# Patient Record
Sex: Female | Born: 1980 | Race: Black or African American | Hispanic: No | Smoking: Never smoker
Health system: Southern US, Community
[De-identification: ages and names within clinical notes are randomized; demographics above are authoritative.]

## PROBLEM LIST (undated history)

## (undated) DIAGNOSIS — L509 Urticaria, unspecified: Secondary | ICD-10-CM

## (undated) HISTORY — PX: NO PAST SURGERIES: SHX2092

## (undated) HISTORY — DX: Urticaria, unspecified: L50.9

---

## 2001-05-08 ENCOUNTER — Emergency Department (HOSPITAL_COMMUNITY): Admission: EM | Admit: 2001-05-08 | Discharge: 2001-05-08 | Payer: Self-pay | Admitting: *Deleted

## 2003-08-29 ENCOUNTER — Ambulatory Visit: Admission: RE | Admit: 2003-08-29 | Discharge: 2003-08-29 | Payer: Self-pay

## 2013-08-09 ENCOUNTER — Ambulatory Visit (INDEPENDENT_AMBULATORY_CARE_PROVIDER_SITE_OTHER): Payer: 59 | Admitting: Physician Assistant

## 2013-08-09 VITALS — BP 140/80 | HR 75 | Temp 98.5°F | Resp 16 | Ht 65.0 in | Wt 167.0 lb

## 2013-08-09 DIAGNOSIS — J019 Acute sinusitis, unspecified: Secondary | ICD-10-CM

## 2013-08-09 MED ORDER — LEVOFLOXACIN 500 MG PO TABS: 500.0000 mg | ORAL_TABLET | Freq: Every day | ORAL | Status: DC

## 2013-08-09 NOTE — Patient Instructions (Signed)

## 2013-08-09 NOTE — Progress Notes (Signed)
Subjective:    Patient ID: Kelly Carney, female    DOB: [birth year], 33 y.o.   MRN: [id number]  HPI 33 y.o. female presents with 1 month history of on and off nasal congestion, sinus pressure, and fatigue. Patient initially presented to her dentist a little over a month ago with the above and was prescribed a Z pack for sinusitis. After taking this her symptoms did improve for a week or two, however they have returned prompting her to come in for evaluation. Her sinus pressure is located along the right maxillary sinus. Some nasal congestion. No drainage. She feels like all of the congestion is stuck in her sinus. Normal hearing. No otalgia. No cough. No SOB or wheezing. Afebrile. No chills. She does have a sinus headache. She has previously gotten some sinus infections, but none recently.    PMH: No past medical history on file.  Home Meds: Prior to Admission medications   Not on File    Allergies: No Known Allergies  History   Social History  . Marital Status: Single    Spouse Name: N/A    Number of Children: N/A  . Years of Education: N/A   Occupational History  . Not on file.   Social History Main Topics  . Smoking status: Never Smoker   . Smokeless tobacco: Not on file  . Alcohol Use: Not on file  . Drug Use: Not on file  . Sexual Activity: Not on file   Other Topics Concern  . Not on file   Social History Narrative  . No narrative on file      Review of Systems  Constitutional: Positive for fatigue. Negative for fever, chills and appetite change.  HENT: Positive for congestion and sinus pressure. Negative for ear pain, hearing loss, postnasal drip, rhinorrhea, sore throat and tinnitus.   Respiratory: Negative for cough, shortness of breath and wheezing.   Gastrointestinal: Negative for nausea, vomiting and diarrhea.  Musculoskeletal: Negative for myalgias.  Neurological: Positive for headaches.       Objective:   Physical Exam  Physical  Exam: Blood pressure 140/80, pulse 75, temperature 98.5 F (36.9 C), temperature source Oral, resp. rate 16, height 5\' 5"  (1.651 m), weight 167 lb (75.751 kg), last menstrual period 07/19/2013, SpO2 100.00%., Body mass index is 27.79 kg/(m^2). General: Well developed, well nourished, in no acute distress. Head: Normocephalic, atraumatic, eyes without discharge, sclera non-icteric, nares are congested. Bilateral auditory canals clear, TM's are without perforation, pearly grey with reflective cone of light bilaterally. Serous effusion bilaterally behind TM's. Maxillary sinus pressure relieved with palpation. Oral cavity moist, dentition normal. Posterior pharynx with post nasal drip and mild erythema. No peritonsillar abscess or tonsillar exudate. Uvula midline.  Neck: Supple. No thyromegaly. Full ROM. No lymphadenopathy. Lungs: Clear bilaterally to auscultation without wheezes, rales, or rhonchi. Breathing is unlabored.  Heart: RRR with S1 S2. No murmurs, rubs, or gallops appreciated. Msk:  Strength and tone normal for age. Extremities: No clubbing or cyanosis. No edema. Neuro: Alert and oriented X 3. Moves all extremities spontaneously. CNII-XII grossly in tact. Psych:  Responds to questions appropriately with a normal affect.        Assessment & Plan:  33 year old female with sinusitis -Levaquin 500 mg 1 po daily #10 no RF -Mucinex -Avoid running while on Levaquin, discussed Achilles tendon rupture risk  -If symptoms persist while on this antibiotic consider imaging    Christell Faith, MHS, PA-C [facility] [address] [place], [place] [address] [facility] 08/09/2013 7:48 PM

## 2015-11-05 DIAGNOSIS — Z832 Family history of diseases of the blood and blood-forming organs and certain disorders involving the immune mechanism: Secondary | ICD-10-CM | POA: Diagnosis not present

## 2015-11-05 DIAGNOSIS — Z13220 Encounter for screening for lipoid disorders: Secondary | ICD-10-CM | POA: Diagnosis not present

## 2015-11-05 DIAGNOSIS — L509 Urticaria, unspecified: Secondary | ICD-10-CM | POA: Diagnosis not present

## 2015-11-05 DIAGNOSIS — Z0000 Encounter for general adult medical examination without abnormal findings: Secondary | ICD-10-CM | POA: Diagnosis not present

## 2015-11-05 DIAGNOSIS — E559 Vitamin D deficiency, unspecified: Secondary | ICD-10-CM | POA: Diagnosis not present

## 2015-11-09 DIAGNOSIS — Z832 Family history of diseases of the blood and blood-forming organs and certain disorders involving the immune mechanism: Secondary | ICD-10-CM | POA: Diagnosis not present

## 2015-11-09 DIAGNOSIS — Z0000 Encounter for general adult medical examination without abnormal findings: Secondary | ICD-10-CM | POA: Diagnosis not present

## 2015-11-09 DIAGNOSIS — E559 Vitamin D deficiency, unspecified: Secondary | ICD-10-CM | POA: Diagnosis not present

## 2015-11-09 DIAGNOSIS — L509 Urticaria, unspecified: Secondary | ICD-10-CM | POA: Diagnosis not present

## 2015-11-09 DIAGNOSIS — Z13220 Encounter for screening for lipoid disorders: Secondary | ICD-10-CM | POA: Diagnosis not present

## 2015-11-19 ENCOUNTER — Encounter: Payer: Self-pay | Admitting: Pediatrics

## 2015-11-19 ENCOUNTER — Ambulatory Visit: Payer: Self-pay | Admitting: Pediatrics

## 2015-11-19 ENCOUNTER — Ambulatory Visit (INDEPENDENT_AMBULATORY_CARE_PROVIDER_SITE_OTHER): Payer: 59 | Admitting: Pediatrics

## 2015-11-19 VITALS — BP 128/84 | HR 88 | Temp 98.9°F | Resp 18 | Ht 64.37 in | Wt 142.2 lb

## 2015-11-19 DIAGNOSIS — L501 Idiopathic urticaria: Secondary | ICD-10-CM | POA: Diagnosis not present

## 2015-11-19 DIAGNOSIS — L500 Allergic urticaria: Secondary | ICD-10-CM | POA: Diagnosis not present

## 2015-11-19 DIAGNOSIS — J3089 Other allergic rhinitis: Secondary | ICD-10-CM

## 2015-11-19 DIAGNOSIS — R5383 Other fatigue: Secondary | ICD-10-CM | POA: Diagnosis not present

## 2015-11-19 NOTE — Patient Instructions (Addendum)
Zyrtec 10 mg once or twice a day for itching or runny nose or itchy eyes Do foods with salicylates make you itch? Avoid foods with large amounts of tomato Environmental control of dust mite  I will call you with the results of your lab work. If you have not heard from me in one week call me

## 2015-11-19 NOTE — Progress Notes (Signed)
[address] [place] [address] Dept: [phone]  New Patient Note  Patient ID: Kelly Carney, female    DOB: 1981-07-04  Age: 35 y.o. MRN: [id number] Date of Office Visit: 11/19/2015 Referring provider: Harlan Stains, MD [place], [place] [address]    Chief Complaint: Urticaria  HPI Kelly Carney presents for evaluation of an itchy rash for about 2 years. The rash  Lasts  5-10 minutes. It is usually worse at night. It happens every 2-3 weeks. One  episode happened when she was outside and her neighbor was mowing the lawn. There are no clearcut precipitants to this rash. She has a history of eczema when she was 57-38 years of age. Sometimes in the springtime she has some nasal congestion.   Review of Systems  Constitutional: Negative.   HENT:       Mild nasal congestion in the springtime  Eyes: Negative.   Respiratory: Negative.   Cardiovascular: Negative.   Gastrointestinal:       She used to have heartburn. Not a problem at this time  Genitourinary: Negative.   Musculoskeletal: Negative.   Skin:       Hives off and on for 2 years  Neurological: Negative.   Endo/Heme/Allergies: Negative.   Psychiatric/Behavioral: Negative.     Outpatient Encounter Prescriptions as of 11/19/2015  Medication Sig  . MONO-LINYAH 0.25-35 MG-MCG tablet   . Multiple Vitamins-Minerals (HAIR SKIN AND NAILS FORMULA PO) Take 1 capsule by mouth daily.  . Omega-3 Fatty Acids (FISH OIL) 1000 MG CAPS Take 1 capsule by mouth daily.  . [DISCONTINUED] levofloxacin (LEVAQUIN) 500 MG tablet Take 1 tablet (500 mg total) by mouth daily.  . [DISCONTINUED] norethindrone-ethinyl estradiol (MICROGESTIN,JUNEL,LOESTRIN) 1-20 MG-MCG tablet Take 1 tablet by mouth daily.   No facility-administered encounter medications on file as of 11/19/2015.     Drug Allergies:  No Known Allergies  Family History: Lafawn's family history includes Hypertension in her mother;  Sarcoidosis in her father and sister. There is no history of Allergic rhinitis, Angioedema, Asthma, Eczema, Immunodeficiency, or Urticaria.. No family history of Lupus  Social and environmental. She is a Marine scientist in the hospital. There are no pets in the home. She has never smoked cigarettes. She is not exposed to cigarette smoking.  Physical Exam: BP 128/84 mmHg  Pulse 88  Temp(Src) 98.9 F (37.2 C) (Oral)  Resp 18  Ht 5' 4.37" (1.635 m)  Wt 142 lb 3.2 oz (64.5 kg)  BMI 24.13 kg/m2   Physical Exam  Constitutional: She is oriented to person, place, and time. She appears well-developed and well-nourished.  HENT:  Eyes were prominent. No erythema in the eyes. Ears normal. Nose normal. Pharynx normal.  Neck: Neck supple. No thyromegaly present.  Cardiovascular:  S1 and S2 normal no murmurs  Pulmonary/Chest:  Clear to percussion and auscultation  Abdominal: Soft. There is no tenderness (no hepatosplenomegaly).  Lymphadenopathy:    She has no cervical adenopathy.  Neurological: She is alert and oriented to person, place, and time.  Skin:  Clear with a suggestion of dermographia  Psychiatric: She has a normal mood and affect. Her behavior is normal. Judgment and thought content normal.  Vitals reviewed.   Diagnostics:  Allergy skin tests were very positive to dust mite. There was some reactivity to tomato but not extremely large  Assessment Assessment and Plan: 1. Allergic urticaria   2. Other allergic rhinitis   3. Idiopathic urticaria   4. Other fatigue  Patient Instructions  Zyrtec 10 mg once or twice a day for itching or runny nose or itchy eyes Do foods with salicylates make you itch? Avoid foods with large amounts of tomato Environmental control of dust mite  I will call you with the results of your lab work. If you have not heard from me in one week call me     Return in about 3 weeks (around 12/10/2015).   Thank you for the opportunity to care for  this patient.  Please do not hesitate to contact me with questions.  Penne Lash, M.D.  [facility] [address] [place], [place] [address] [phone]

## 2015-11-20 ENCOUNTER — Telehealth: Payer: Self-pay | Admitting: Pediatrics

## 2015-11-20 NOTE — Telephone Encounter (Signed)
Pt called and said that she had some of the labs done at dr white"s office about 2 weeks ago. And wanted to know how to get just the ones that she has not had done. And [place] get the results from the dr white office. [phone]

## 2015-11-20 NOTE — Telephone Encounter (Signed)
Patient advised to call Dr. Orest Dikes office and have them fax [place] the results.

## 2015-11-23 DIAGNOSIS — L501 Idiopathic urticaria: Secondary | ICD-10-CM | POA: Diagnosis not present

## 2015-11-23 DIAGNOSIS — R5383 Other fatigue: Secondary | ICD-10-CM | POA: Diagnosis not present

## 2015-11-28 ENCOUNTER — Telehealth: Payer: Self-pay | Admitting: Allergy

## 2015-11-28 LAB — ANA W/REFLEX IF POSITIVE
Anti Nuclear Antibody(ANA): POSITIVE — AB
Centromere Ab Screen: <0.2 AI (ref 0.0–0.9)
ENA RNP Ab: <0.2 AI (ref 0.0–0.9)
ENA SM Ab Ser-aCnc: <0.2 AI (ref 0.0–0.9)
dsDNA Ab: 12 IU/mL — ABNORMAL HIGH (ref 0–9)

## 2015-11-28 LAB — SEDIMENTATION RATE: SED RATE: 7 mm/h (ref 0–32)

## 2015-11-28 LAB — TSH: TSH: 1.52 u[IU]/mL (ref 0.450–4.500)

## 2015-11-28 LAB — T4, FREE: Free T4: 1.22 ng/dL (ref 0.82–1.77)

## 2015-11-28 LAB — THYROID PEROXIDASE ANTIBODY: THYROID PEROXIDASE ANTIBODY: 7 [IU]/mL (ref 0–34)

## 2015-11-28 LAB — THYROGLOBULIN LEVEL: Thyroglobulin (TG-RIA): 50 ng/mL — ABNORMAL HIGH

## 2015-11-28 NOTE — Telephone Encounter (Signed)
Patient is calling Dr. Shaune Leeks back and would like to know what is a Positive ANA. Also she would like to know why she has to make a 3 month follow up.   Please Advise  Thanks

## 2015-11-28 NOTE — Telephone Encounter (Signed)
Mailed copy of labs to patient and faxed copies  to Dr.Cynthia White.

## 2015-11-30 NOTE — Telephone Encounter (Signed)
Phone number is [phone]

## 2015-11-30 NOTE — Telephone Encounter (Signed)
Please call patient and find out what telephone number where she can be reached

## 2015-12-04 NOTE — Telephone Encounter (Signed)
I left a message with the patient to call me at [phone]. Her lab tests results were sent to Dr. Harlan Stains. The serum ANA was slightly elevated and should be repeated in about 3 months unless she develops other problems.

## 2015-12-10 ENCOUNTER — Ambulatory Visit: Payer: 59 | Admitting: Pediatrics

## 2016-04-29 DIAGNOSIS — L509 Urticaria, unspecified: Secondary | ICD-10-CM | POA: Diagnosis not present

## 2016-04-29 DIAGNOSIS — R768 Other specified abnormal immunological findings in serum: Secondary | ICD-10-CM | POA: Diagnosis not present

## 2016-04-29 DIAGNOSIS — M25562 Pain in left knee: Secondary | ICD-10-CM | POA: Diagnosis not present

## 2016-05-05 ENCOUNTER — Ambulatory Visit
Admission: RE | Admit: 2016-05-05 | Discharge: 2016-05-05 | Disposition: A | Discharge status: Home / Self Care | Payer: Self-pay | Source: Ambulatory Visit | Attending: Family Medicine | Admitting: Family Medicine

## 2016-05-05 ENCOUNTER — Other Ambulatory Visit: Payer: Self-pay | Admitting: Family Medicine

## 2016-05-05 DIAGNOSIS — M25562 Pain in left knee: Secondary | ICD-10-CM

## 2016-06-02 DIAGNOSIS — Z01419 Encounter for gynecological examination (general) (routine) without abnormal findings: Secondary | ICD-10-CM | POA: Diagnosis not present

## 2016-06-02 DIAGNOSIS — Z124 Encounter for screening for malignant neoplasm of cervix: Secondary | ICD-10-CM | POA: Diagnosis not present

## 2016-06-02 DIAGNOSIS — Z6824 Body mass index (BMI) 24.0-24.9, adult: Secondary | ICD-10-CM | POA: Diagnosis not present

## 2016-08-22 DIAGNOSIS — H35411 Lattice degeneration of retina, right eye: Secondary | ICD-10-CM | POA: Diagnosis not present

## 2016-09-21 DIAGNOSIS — S43401A Unspecified sprain of right shoulder joint, initial encounter: Secondary | ICD-10-CM | POA: Diagnosis not present

## 2016-10-03 ENCOUNTER — Encounter: Payer: Self-pay | Admitting: Allergy

## 2016-10-03 ENCOUNTER — Ambulatory Visit (INDEPENDENT_AMBULATORY_CARE_PROVIDER_SITE_OTHER): Payer: 59 | Admitting: Allergy

## 2016-10-03 VITALS — BP 114/64 | HR 74 | Temp 97.8°F | Wt 151.8 lb

## 2016-10-03 DIAGNOSIS — J3089 Other allergic rhinitis: Secondary | ICD-10-CM | POA: Diagnosis not present

## 2016-10-03 DIAGNOSIS — L500 Allergic urticaria: Secondary | ICD-10-CM | POA: Diagnosis not present

## 2016-10-03 NOTE — Progress Notes (Signed)
Follow-up Note  RE: Kelly Carney MRN: [id number] DOB: [birth year] Date of Office Visit: 10/03/2016   History of present illness: Kelly Carney is a 36 y.o. female presenting today for follow-up of allergic urticaria. She was seen by Dr. Mohammed Kindle list as a new patient in May 2017. She had some testing done at that time showing positive sensitivity to dust mite and mild reactivity to tomato. She also has a elevated thyroglobulin antibody and also had positive ANA and double-stranded DNA antibody. She has since seen her primary care provider who repeated her ANA and reports that it was negative.  Since her last visit she reports that she takes Zyrtec daily if not every other day. She notes that she will fill itchy on the days that she does not take her Zyrtec. She does not have frank hives with the itching however but she has had flares of her hives on several occasions since her last visit. The last flareup was about 3-4 months ago when she reports she was exposed to the cold temperatures which she has noticed seems to flare her symptoms. She also notes that with grass exposure she will develop hives.  She has been avoiding tomato based products especially in large quantities  She does not completely avoid tomato.  Prior to the testing she denies having any issues surrounding tomato ingestion.   Review of systems: Review of Systems  Constitutional: Negative for chills, fever and malaise/fatigue.  HENT: Negative for congestion, ear discharge, ear pain, nosebleeds, sinus pain, sore throat and tinnitus.   Eyes: Negative for discharge and redness.  Respiratory: Negative for cough, shortness of breath and wheezing.   Cardiovascular: Negative for chest pain.  Gastrointestinal: Negative for abdominal pain, heartburn, nausea and vomiting.  Musculoskeletal: Negative for joint pain and myalgias.  Skin: Positive for itching. Negative for rash.  Neurological: Negative for headaches.    All other  systems negative unless noted above in HPI  Past medical/social/surgical/family history have been reviewed and are unchanged unless specifically indicated below.  No changes  Medication List: Allergies as of 10/03/2016   No Known Allergies     Medication List       Accurate as of 10/03/16 11:47 AM. Always use your most recent med list.          Fish Oil 1000 MG Caps Take 1 capsule by mouth daily.   HAIR SKIN AND NAILS FORMULA PO Take 1 capsule by mouth daily.   MONO-LINYAH 0.25-35 MG-MCG tablet Generic drug:  norgestimate-ethinyl estradiol       Known medication allergies: No Known Allergies   Physical examination: Blood pressure 114/64, pulse 74, temperature 97.8 F (36.6 C), temperature source Oral, weight 151 lb 12.8 oz (68.9 kg), SpO2 97 %.  General: Alert, interactive, in no acute distress. HEENT: PERRLA, TMs pearly gray, turbinates minimally edematous without discharge, post-pharynx non erythematous. Neck: Supple without lymphadenopathy. Lungs: Clear to auscultation without wheezing, rhonchi or rales. {no increased work of breathing. CV: Normal S1, S2 without murmurs. Abdomen: Nondistended, nontender. Skin: Warm and dry, without lesions or rashes. Extremities:  No clubbing, cyanosis or edema. Neuro:   Grossly intact.  Diagnositics/Labs: Labs:  Component     Latest Ref Rng & Units 11/23/2015  Anit Nuclear Antibody(ANA)     Negative Positive (A)  dsDNA Ab     0 - 9 IU/mL 12 (H)  ENA RNP Ab     0.0 - 0.9 AI <0.2  ENA SM Ab Ser-aCnc  0.0 - 0.9 AI <0.2  Scleroderma SCL-70     0.0 - 0.9 AI <0.2  ENA SSA (RO) Ab     0.0 - 0.9 AI <0.2  ENA SSB (LA) Ab     0.0 - 0.9 AI <0.2  Chromatin Ab SerPl-aCnc     0.0 - 0.9 AI <0.2  Anti JO-1     0.0 - 0.9 AI <0.2  CENTROMERE AB SCREEN     0.0 - 0.9 AI <0.2  SEE BELOW      Comment  Sed Rate     0 - 32 mm/hr 7  THYROGLOBULIN (TG-RIA)     ng/mL 50 (H)  Thyroperoxidase Ab SerPl-aCnc     0 - 34 IU/mL 7    TSH     0.450 - 4.500 uIU/mL 1.520  T4,Free(Direct)     0.82 - 1.77 ng/dL 1.22   Assessment and plan: Urticaria with angioedema     - she has an allergic trigger with grass exposure and an autoimmune signal with elevated thyroglobulin antibody that can predispose/manifest as urticaria with angioedema.   - based on her food allergy testing I do not feel she has a tomato allergy however some acidic foods and fruits as well as NSAIDs are known mast cell activator and histamine releasers and can worsen/flare hives and swelling     - If you are well controlled on Zyrtec daily recommend continuing this.   If you are not controlled on daily Zyrtec you may increase to twice a day dosing.   Let us know if you are not controlled with daily zyrtec.      - let us know if hives leave any marks or bruising, you have fevers with hives or joint aches or pain.     Follow-up 1 year or as needed   I appreciate the opportunity to take part in Jalana's care. Please do not hesitate to contact me with questions.  Sincerely,   Prudy Feeler, MD Allergy/Immunology [facility]

## 2016-10-03 NOTE — Patient Instructions (Signed)
Hives and swelling      - you have allergic environmental trigger with grass exposure and an autoimmune signal with elevated thyroglobulin antibody that can predispose you to having hives and swelling.      - If you are well controlled on Zyrtec daily recommend continuing this.   If you are not controlled on daily Zyrtec you may increase to twice a day dosing.   Let us know if you are not controlled with daily zyrtec.      - let us know if hives leave any marks or bruising, you have fevers with hives or joint aches or pain.      - based on your food allergy testing I do not feel you have a tomato allergy however some acidic foods and fruits as well as NSAID medications (like ibuprofen) can worsen/flare hives and swelling  Follow-up 1 year or as needed

## 2016-12-10 DIAGNOSIS — Z136 Encounter for screening for cardiovascular disorders: Secondary | ICD-10-CM | POA: Diagnosis not present

## 2016-12-10 DIAGNOSIS — Z0000 Encounter for general adult medical examination without abnormal findings: Secondary | ICD-10-CM | POA: Diagnosis not present

## 2016-12-10 DIAGNOSIS — E559 Vitamin D deficiency, unspecified: Secondary | ICD-10-CM | POA: Diagnosis not present

## 2016-12-11 DIAGNOSIS — E559 Vitamin D deficiency, unspecified: Secondary | ICD-10-CM | POA: Diagnosis not present

## 2016-12-11 DIAGNOSIS — Z0000 Encounter for general adult medical examination without abnormal findings: Secondary | ICD-10-CM | POA: Diagnosis not present

## 2016-12-23 DIAGNOSIS — M25562 Pain in left knee: Secondary | ICD-10-CM | POA: Diagnosis not present

## 2020-08-27 ENCOUNTER — Other Ambulatory Visit: Payer: Self-pay | Admitting: Obstetrics and Gynecology

## 2020-08-27 DIAGNOSIS — N915 Oligomenorrhea, unspecified: Secondary | ICD-10-CM

## 2020-10-05 ENCOUNTER — Ambulatory Visit
Admission: RE | Admit: 2020-10-05 | Discharge: 2020-10-05 | Disposition: A | Discharge status: Home / Self Care | Payer: 59 | Source: Ambulatory Visit | Attending: Obstetrics and Gynecology | Admitting: Obstetrics and Gynecology

## 2020-10-05 DIAGNOSIS — N915 Oligomenorrhea, unspecified: Secondary | ICD-10-CM

## 2020-10-12 ENCOUNTER — Other Ambulatory Visit: Payer: 59

## 2020-10-22 ENCOUNTER — Other Ambulatory Visit: Payer: 59

## 2021-02-08 ENCOUNTER — Other Ambulatory Visit: Payer: Self-pay | Admitting: Obstetrics and Gynecology

## 2021-02-08 DIAGNOSIS — D352 Benign neoplasm of pituitary gland: Secondary | ICD-10-CM

## 2021-02-10 ENCOUNTER — Ambulatory Visit
Admission: RE | Admit: 2021-02-10 | Discharge: 2021-02-10 | Disposition: A | Discharge status: Home / Self Care | Payer: BLUE CROSS/BLUE SHIELD | Source: Ambulatory Visit | Attending: Obstetrics and Gynecology | Admitting: Obstetrics and Gynecology

## 2021-02-10 ENCOUNTER — Other Ambulatory Visit: Payer: Self-pay

## 2021-02-10 DIAGNOSIS — D352 Benign neoplasm of pituitary gland: Secondary | ICD-10-CM

## 2021-02-10 MED ORDER — GADOBENATE DIMEGLUMINE 529 MG/ML IV SOLN
9.0000 mL | Freq: Once | INTRAVENOUS | Status: AC | PRN
  Administered 2021-02-10: 9 mL via INTRAVENOUS

## 2021-04-17 ENCOUNTER — Ambulatory Visit (HOSPITAL_BASED_OUTPATIENT_CLINIC_OR_DEPARTMENT_OTHER): Payer: BLUE CROSS/BLUE SHIELD | Admitting: Cardiovascular Disease

## 2021-06-04 ENCOUNTER — Ambulatory Visit (HOSPITAL_BASED_OUTPATIENT_CLINIC_OR_DEPARTMENT_OTHER): Payer: BLUE CROSS/BLUE SHIELD | Admitting: Cardiovascular Disease

## 2021-07-02 ENCOUNTER — Encounter (HOSPITAL_BASED_OUTPATIENT_CLINIC_OR_DEPARTMENT_OTHER): Payer: Self-pay | Admitting: Cardiovascular Disease

## 2021-07-02 ENCOUNTER — Ambulatory Visit (HOSPITAL_BASED_OUTPATIENT_CLINIC_OR_DEPARTMENT_OTHER): Payer: BLUE CROSS/BLUE SHIELD | Admitting: Cardiovascular Disease

## 2021-07-02 ENCOUNTER — Other Ambulatory Visit: Payer: Self-pay

## 2021-07-02 ENCOUNTER — Encounter (HOSPITAL_BASED_OUTPATIENT_CLINIC_OR_DEPARTMENT_OTHER): Payer: Self-pay

## 2021-07-02 DIAGNOSIS — I10 Essential (primary) hypertension: Secondary | ICD-10-CM

## 2021-07-02 DIAGNOSIS — Z006 Encounter for examination for normal comparison and control in clinical research program: Secondary | ICD-10-CM

## 2021-07-02 HISTORY — DX: Essential (primary) hypertension: I10

## 2021-07-02 MED ORDER — AMLODIPINE BESYLATE 10 MG PO TABS: 10.0000 mg | ORAL_TABLET | Freq: Every day | ORAL | 3 refills | Status: DC

## 2021-07-02 NOTE — Assessment & Plan Note (Signed)
Blood pressure is elevated both initially and on repeat.  At first there was asymmetry between her arms but not on repeat.  We will increase her amlodipine to 10 mg.  We discussed the fact that her elevated blood pressures are likely due to having a more sedentary lifestyle and gaining weight.  She is going to work on increasing her exercise and wants to enroll in the PREP program through the [facility].  She will also continue to limit her sodium intake.  She is interested in enrolling in our remote patient monitoring program and consents to monitoring through the [employer] remote patient monitoring system.  She will track her blood pressures twice daily and bring to follow-up.  She was given an advance hypertension clinic education book.  Given her age we will check renal artery Dopplers to ensure that there is no renal artery stenosis.

## 2021-07-02 NOTE — Patient Instructions (Signed)
Medication Instructions:  INCREASE AMLODIPINE TO 10 MG DAILY    Labwork: NONE   Testing/Procedures: Your physician has requested that you have a renal artery duplex. During this test, an ultrasound is used to evaluate blood flow to the kidneys. Allow one hour for this exam. Do not eat after midnight the day before and avoid carbonated beverages. Take your medications as you usually do.   Follow-Up: 09/04/2021 10:30 AM WITH PHARM D AT [place] OFFICE  11/06/2021 10:00 AM WITH DR Arona AT [place] will receive a phone call from the PREP exercise and nutrition program to schedule an initial assessment.  Referrals:  WILL HAVE AMY OUR CARE GUIDE REACH OUT TO YOU  FOR STRESS MANAGEMENT   Special Instructions:   MONITOR YOUR BLOOD PRESSURE TWICE A DAY WITH MACHINE GIVEN, MAKE SURE YOU ARE IN APP WHEN CHECKING   DASH Eating Plan DASH stands for "Dietary Approaches to Stop Hypertension." The DASH eating plan is a healthy eating plan that has been shown to reduce high blood pressure (hypertension). It may also reduce your risk for type 2 diabetes, heart disease, and stroke. The DASH eating plan may also help with weight loss. What are tips for following this plan?  General guidelines Avoid eating more than 2,300 mg (milligrams) of salt (sodium) a day. If you have hypertension, you may need to reduce your sodium intake to 1,500 mg a day. Limit alcohol intake to no more than 1 drink a day for nonpregnant women and 2 drinks a day for men. One drink equals 12 oz of beer, 5 oz of wine, or 1 oz of hard liquor. Work with your health care provider to maintain a healthy body weight or to lose weight. Ask what an ideal weight is for you. Get at least 30 minutes of exercise that causes your heart to beat faster (aerobic exercise) most days of the week. Activities may include walking, swimming, or biking. Work with your health care provider or diet and nutrition specialist (dietitian)  to adjust your eating plan to your individual calorie needs. Reading food labels  Check food labels for the amount of sodium per serving. Choose foods with less than 5 percent of the Daily Value of sodium. Generally, foods with less than 300 mg of sodium per serving fit into this eating plan. To find whole grains, look for the word "whole" as the first word in the ingredient list. Shopping Buy products labeled as "low-sodium" or "no salt added." Buy fresh foods. Avoid canned foods and premade or frozen meals. Cooking Avoid adding salt when cooking. Use salt-free seasonings or herbs instead of table salt or sea salt. Check with your health care provider or pharmacist before using salt substitutes. Do not fry foods. Cook foods using healthy methods such as baking, boiling, grilling, and broiling instead. Cook with heart-healthy oils, such as olive, canola, soybean, or sunflower oil. Meal planning Eat a balanced diet that includes: 5 or more servings of fruits and vegetables each day. At each meal, try to fill half of your plate with fruits and vegetables. Up to 6-8 servings of whole grains each day. Less than 6 oz of lean meat, poultry, or fish each day. A 3-oz serving of meat is about the same size as a deck of cards. One egg equals 1 oz. 2 servings of low-fat dairy each day. A serving of nuts, seeds, or beans 5 times each week. Heart-healthy fats. Healthy fats called Omega-3 fatty acids are found  in foods such as flaxseeds and coldwater fish, like sardines, salmon, and mackerel. Limit how much you eat of the following: Canned or prepackaged foods. Food that is high in trans fat, such as fried foods. Food that is high in saturated fat, such as fatty meat. Sweets, desserts, sugary drinks, and other foods with added sugar. Full-fat dairy products. Do not salt foods before eating. Try to eat at least 2 vegetarian meals each week. Eat more home-cooked food and less restaurant, buffet, and fast  food. When eating at a restaurant, ask that your food be prepared with less salt or no salt, if possible. What foods are recommended? The items listed may not be a complete list. Talk with your dietitian about what dietary choices are best for you. Grains Whole-grain or whole-wheat bread. Whole-grain or whole-wheat pasta. Brown rice. [place]row. Bulgur. Whole-grain and low-sodium cereals. Pita bread. Low-fat, low-sodium crackers. Whole-wheat flour tortillas. Vegetables Fresh or frozen vegetables (raw, steamed, roasted, or grilled). Low-sodium or reduced-sodium tomato and vegetable juice. Low-sodium or reduced-sodium tomato sauce and tomato paste. Low-sodium or reduced-sodium canned vegetables. Fruits All fresh, dried, or frozen fruit. Canned fruit in natural juice (without added sugar). Meat and other protein foods Skinless chicken or [place]. Ground chicken or [place]. Pork with fat trimmed off. Fish and seafood. Egg whites. Dried beans, peas, or lentils. Unsalted nuts, nut butters, and seeds. Unsalted canned beans. Lean cuts of beef with fat trimmed off. Low-sodium, lean deli meat. Dairy Low-fat (1%) or fat-free (skim) milk. Fat-free, low-fat, or reduced-fat cheeses. Nonfat, low-sodium ricotta or cottage cheese. Low-fat or nonfat yogurt. Low-fat, low-sodium cheese. Fats and oils Soft margarine without trans fats. Vegetable oil. Low-fat, reduced-fat, or light mayonnaise and salad dressings (reduced-sodium). Canola, safflower, olive, soybean, and sunflower oils. Avocado. Seasoning and other foods Herbs. Spices. Seasoning mixes without salt. Unsalted popcorn and pretzels. Fat-free sweets. What foods are not recommended? The items listed may not be a complete list. Talk with your dietitian about what dietary choices are best for you. Grains Baked goods made with fat, such as croissants, muffins, or some breads. Dry pasta or rice meal packs. Vegetables Creamed or fried vegetables. Vegetables  in a cheese sauce. Regular canned vegetables (not low-sodium or reduced-sodium). Regular canned tomato sauce and paste (not low-sodium or reduced-sodium). Regular tomato and vegetable juice (not low-sodium or reduced-sodium). Angie Fava. Olives. Fruits Canned fruit in a light or heavy syrup. Fried fruit. Fruit in cream or butter sauce. Meat and other protein foods Fatty cuts of meat. Ribs. Fried meat. Berniece Salines. Sausage. Bologna and other processed lunch meats. Salami. Fatback. Hotdogs. Bratwurst. Salted nuts and seeds. Canned beans with added salt. Canned or smoked fish. Whole eggs or egg yolks. Chicken or [place] with skin. Dairy Whole or 2% milk, cream, and half-and-half. Whole or full-fat cream cheese. Whole-fat or sweetened yogurt. Full-fat cheese. Nondairy creamers. Whipped toppings. Processed cheese and cheese spreads. Fats and oils Butter. Stick margarine. Lard. Shortening. Ghee. Bacon fat. Tropical oils, such as coconut, palm kernel, or palm oil. Seasoning and other foods Salted popcorn and pretzels. Onion salt, garlic salt, seasoned salt, table salt, and sea salt. Worcestershire sauce. Tartar sauce. Barbecue sauce. Teriyaki sauce. Soy sauce, including reduced-sodium. Steak sauce. Canned and packaged gravies. Fish sauce. Oyster sauce. Cocktail sauce. Horseradish that you find on the shelf. Ketchup. Mustard. Meat flavorings and tenderizers. Bouillon cubes. Hot sauce and Tabasco sauce. Premade or packaged marinades. Premade or packaged taco seasonings. Relishes. Regular salad dressings. Where to find more information: National Heart,  Lung, and Blood Institute: https://wilson-eaton.com/ American Heart Association: www.heart.org Summary The DASH eating plan is a healthy eating plan that has been shown to reduce high blood pressure (hypertension). It may also reduce your risk for type 2 diabetes, heart disease, and stroke. With the DASH eating plan, you should limit salt (sodium) intake to 2,300 mg a day. If you  have hypertension, you may need to reduce your sodium intake to 1,500 mg a day. When on the DASH eating plan, aim to eat more fresh fruits and vegetables, whole grains, lean proteins, low-fat dairy, and heart-healthy fats. Work with your health care provider or diet and nutrition specialist (dietitian) to adjust your eating plan to your individual calorie needs. This information is not intended to replace advice given to you by your health care provider. Make sure you discuss any questions you have with your health care provider. Document Released: 06/26/2011 Document Revised: 06/19/2017 Document Reviewed: 06/30/2016 Elsevier Patient Education  2020 [employer].

## 2021-07-02 NOTE — Research (Signed)
Subject Name: Kelly Carney met inclusion and exclusion criteria for the Virtual Care and Social Determinant Interventions for the management of hypertension trial.  The informed consent form, study requirements and expectations were reviewed with the subject by Dr. Oval Linsey and myself. The subject was given the opportunity to read the consent and ask questions. The subject verbalized understanding of the trial requirements.  All questions were addressed prior to the signing of the consent form. The subject agreed to participate in the trial and signed the informed consent. The informed consent was obtained prior to performance of any protocol-specific procedures for the subject.  A copy of the signed informed consent was given to the subject and a copy was placed in the subject's medical record.  [place] was randomized to Group 2.

## 2021-07-02 NOTE — Progress Notes (Signed)
Advanced Hypertension Clinic Initial Assessment:    Date:  07/02/2021   ID:  Kelly Carney, DOB 19-Oct-1980, MRN [id number]  PCP:  Harlan Stains, MD  Cardiologist:  None  Nephrologist:  Referring MD: Servando Salina, MD   CC: Hypertension  History of Present Illness:    Kelly Carney is a 40 y.o. female with a hx of hypertension and a prolactinoma here to establish care in the Advanced Hypertension Clinic.  She saw Dr. Garwin Brothers on 01/2021 and her blood pressure was 139/90 on amlodipine.  There was concerned about her early onset of hypertension and was she was referred for secondary work-up.  Her blood pressure became elevated in 2020 around the time of the pandemic.  She noted that at this time she switched to a less active job.  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  Lately she has been working at [employer] job.  She does not get much exercise outside of work.  She does try to cook at home and eats a healthy diet.  She tries to limit her sodium and carbohydrate intake.  She has gained some weight since making this change.  At home her blood pressures been in the 140s over 90s.  She notes that she has been under a lot of stress.  She does have her sister to use as a sounding board.  She has not had any lower extremity edema, orthopnea, or PND.  She denies chest pain or pressure.  She was recently diagnosed with a prolactinoma.  This occurred in the setting of not having her menstrual cycle for several months.  She was started on a medication that she stopped a few weeks ago because she started feeling lightheaded on it.  She notices that there is no change in her blood pressure with or without treatment of her prolactinoma.   Previous antihypertensives: Amlodipine   Past Medical History:  Diagnosis Date   Essential hypertension 07/02/2021   Urticaria     Past Surgical History:  Procedure Laterality Date   NO PAST SURGERIES      Current  Medications: Current Meds  Medication Sig   amLODipine (NORVASC) 5 MG tablet Take 5 mg by mouth daily.   Loratadine 10 MG CAPS Take 1 tablet by mouth daily.   Multiple Vitamins-Minerals (HAIR SKIN AND NAILS FORMULA PO) Take 1 capsule by mouth daily.   Multiple Vitamins-Minerals (VITAMIN D3 COMPLETE) TABS Take 5,000 Units by mouth daily.   sertraline (ZOLOFT) 100 MG tablet Take 150 mg by mouth daily.     Allergies:   Patient has no known allergies.   Social History   Socioeconomic History   Marital status: Single    Spouse name: Not on file   Number of children: Not on file   Years of education: Not on file   Highest education level: Not on file  Occupational History   Not on file  Tobacco Use   Smoking status: Never   Smokeless tobacco: Never  Substance and Sexual Activity   Alcohol use: Yes   Drug use: No   Sexual activity: Yes    Birth control/protection: Pill  Other Topics Concern   Not on file  Social History Narrative   Not on file   Social Determinants of Health   Financial Resource Strain: Low Risk    Difficulty of Paying Living Expenses: Not hard at all  Food Insecurity: No Food Insecurity   Worried About Running Out of  Food in the Last Year: Never true   [place] in the Last Year: Never true  Transportation Needs: No Transportation Needs   Lack of Transportation (Medical): No   Lack of Transportation (Non-Medical): No  Physical Activity: Inactive   Days of Exercise per Week: 0 days   Minutes of Exercise per Session: 0 min  Stress: Not on file  Social Connections: Not on file     Family History: The patient's family history includes Hypertension in her maternal grandmother and mother; Kidney disease in her maternal grandmother; Other in her father and sister; Sarcoidosis in her father and sister. There is no history of Allergic rhinitis, Angioedema, Asthma, Eczema, Immunodeficiency, or Urticaria.  ROS:   Please see the history of present illness.     All other systems reviewed and are negative.  EKGs/Labs/Other Studies Reviewed:    EKG:  EKG is ordered today.  The ekg ordered today demonstrates sinus rhythm.  Rate 70 bpm.  Recent Labs: No results found for requested labs within last 8760 hours.   Recent Lipid Panel No results found for: CHOL, TRIG, HDL, CHOLHDL, VLDL, LDLCALC, LDLDIRECT  Physical Exam:   VS:  BP (!) 136/94 (BP Location: Left Arm, Patient Position: Sitting, Cuff Size: Normal)   Pulse 70   Ht 5\' 4"  (1.626 m)   Wt 170 lb (77.1 kg)   BMI 29.18 kg/m  , BMI Body mass index is 29.18 kg/m. GENERAL:  Well appearing HEENT: Pupils equal round and reactive, fundi not visualized, oral mucosa unremarkable NECK:  No jugular venous distention, waveform within normal limits, carotid upstroke brisk and symmetric, no bruits, no thyromegaly LUNGS:  Clear to auscultation bilaterally HEART:  RRR.  PMI not displaced or sustained,S1 and S2 within normal limits, no S3, no S4, no clicks, no rubs, no murmurs ABD:  Flat, positive bowel sounds normal in frequency in pitch, no bruits, no rebound, no guarding, no midline pulsatile mass, no hepatomegaly, no splenomegaly EXT:  2 plus pulses throughout, no edema, no cyanosis no clubbing SKIN:  No rashes no nodules NEURO:  Cranial nerves II through XII grossly intact, motor grossly intact throughout PSYCH:  Cognitively intact, oriented to person place and time   ASSESSMENT/PLAN:    Essential hypertension Blood pressure is elevated both initially and on repeat.  At first there was asymmetry between her arms but not on repeat.  We will increase her amlodipine to 10 mg.  We discussed the fact that her elevated blood pressures are likely due to having a more sedentary lifestyle and gaining weight.  She is going to work on increasing her exercise and wants to enroll in the PREP program through the [facility].  She will also continue to limit her sodium intake.  She is interested in enrolling in our  remote patient monitoring program and consents to monitoring through the vivify remote patient monitoring system.  She will track her blood pressures twice daily and bring to follow-up.  She was given an advance hypertension clinic education book.  Given her age we will check renal artery Dopplers to ensure that there is no renal artery stenosis.   Screening for Secondary Hypertension:  Causes 07/02/2021  Drugs/Herbals Screened     - Comments limits salt and carbs.  1 coffee daily.  no significant supplemets  Renovascular HTN Screened     - Comments Check renal artery Dopplers  Sleep Apnea Screened     - Comments snores.  No apnea.  Not always rested.  Feels fatigued during the day  Thyroid Disease Screened  Hyperaldosteronism N/A  Pheochromocytoma N/A  Cushing's Syndrome N/A  Hyperparathyroidism Screened     - Comments calcium wnl  Coarctation of the Aorta Screened     - Comments BP intially asymetric but not on repeat.  Pulses are symmetric  Compliance Screened    Relevant Labs/Studies:    Thyroid  Latest Ref Rng & Units 11/23/2015  TSH 0.450 - 4.500 uIU/mL 1.520             Renovascular  07/02/2021  Renal Artery [place] Completed Yes       Disposition:    FU with MD/PharmD in 2 month    Medication Adjustments/Labs and Tests Ordered: Current medicines are reviewed at length with the patient today.  Concerns regarding medicines are outlined above.  Orders Placed This Encounter  Procedures   Amb Referral To Provider Referral Exercise Program (P.R.E.P)   EKG 12-Lead   VAS US RENAL ARTERY DUPLEX    No orders of the defined types were placed in this encounter.    Signed, Skeet Latch, MD  07/02/2021 10:11 AM    [place]

## 2021-07-03 ENCOUNTER — Telehealth: Payer: Self-pay

## 2021-07-03 DIAGNOSIS — Z0000 Encounter for general adult medical examination without abnormal findings: Secondary | ICD-10-CM

## 2021-07-03 NOTE — Telephone Encounter (Signed)
Called the patient to discuss stress management per referral from Dr. Oval Linsey and for a Viv[employer] 24-hour follow-up call. Patient did not answer. Left a message for patient to return call. Will call the patient next week if they have not returned called by Friday, 12/16.    Brahim Dolman Truman Hayward, [facility] Guide, Health Coach [address] [place] [address] Telephone: [phone] Email: [email or web address]

## 2021-07-04 ENCOUNTER — Telehealth: Payer: Self-pay

## 2021-07-04 NOTE — Telephone Encounter (Signed)
Call to pt reference PREP referral  Explained program to patient Is interested but her work schedule (works between [place] and [place]) makes it difficult and would like to do program when she would be in town Will keep patient on list and would prefer [place] location-will recall her in future

## 2021-07-08 ENCOUNTER — Telehealth: Payer: Self-pay

## 2021-07-08 DIAGNOSIS — Z0000 Encounter for general adult medical examination without abnormal findings: Secondary | ICD-10-CM

## 2021-07-08 NOTE — Telephone Encounter (Signed)
Called patient to complete Vivify follow up call and to inquire about health coaching regarding increasing physical activity per survey responses. Patient stated that she is okay with the prompt times and have no issues using the app. Patient expressed that she finds the health tips beneficial but need to work on motivation to increase physical activity. Patient also travels often and want to figure out how to incorporate physical activity regularly.  Patient has been scheduled for her initial health coaching session on 12/23 at 11:30am. Patient wants to focus on managing her bp and increasing physical activity. Patient will be called at that time.   Carrye Goller Truman Hayward, [facility] Guide, Health Coach [address], [address] [place] [address] Telephone: [phone] Email: [email or web address]

## 2021-07-12 ENCOUNTER — Telehealth: Payer: Self-pay

## 2021-07-12 ENCOUNTER — Ambulatory Visit: Payer: BLUE CROSS/BLUE SHIELD

## 2021-07-12 DIAGNOSIS — Z0000 Encounter for general adult medical examination without abnormal findings: Secondary | ICD-10-CM

## 2021-07-12 NOTE — Telephone Encounter (Signed)
Called patient as scheduled for a health coaching session. Patient inquired if she can call back around 1:30-2:00pm. Will wait for patient's callback. If patient does not return call, will reach out to patient on 12/27.   Necia Kamm Truman Hayward, [facility] Guide, Health Coach [address] [place] [address] Telephone: [phone] Email: [email or web address]

## 2021-07-17 ENCOUNTER — Telehealth: Payer: Self-pay

## 2021-07-17 DIAGNOSIS — Z0000 Encounter for general adult medical examination without abnormal findings: Secondary | ICD-10-CM

## 2021-07-17 NOTE — Telephone Encounter (Signed)
Called patient to reschedule health coaching session. Patient did not answer. Left message for patient to return call.    Jeriah Corkum, [facility] Guide, Health Coach [address] [place] [address] Telephone: [phone] Email: [email or web address]  

## 2021-07-18 ENCOUNTER — Telehealth: Payer: Self-pay

## 2021-07-18 DIAGNOSIS — Z0000 Encounter for general adult medical examination without abnormal findings: Secondary | ICD-10-CM

## 2021-07-18 NOTE — Telephone Encounter (Signed)
Called patient to reschedule health coaching session. Patient asked to be scheduled tomorrow between 11-11:30am. Patient has been scheduled at 11:00am on 12/30. Patient will be called at this time.   Apryle Stowell Truman Hayward, [facility] Guide, Health Coach [address] [place] [address] Telephone: [phone] Email: [email or web address]

## 2021-07-19 ENCOUNTER — Telehealth: Payer: Self-pay

## 2021-07-19 ENCOUNTER — Ambulatory Visit (INDEPENDENT_AMBULATORY_CARE_PROVIDER_SITE_OTHER): Payer: BLUE CROSS/BLUE SHIELD

## 2021-07-19 ENCOUNTER — Other Ambulatory Visit: Payer: Self-pay

## 2021-07-19 DIAGNOSIS — Z0000 Encounter for general adult medical examination without abnormal findings: Secondary | ICD-10-CM

## 2021-07-19 NOTE — Progress Notes (Signed)
Appointment Outcome: Completed, Session #: Initial health coaching session Start time: 11:14am   End time: 12:20pm   Total Mins: 66 minutes  AGREEMENTS SECTION     Overall Goal(s): Stress management Increase physical activity                                          Agreement/Action Steps: Exercise at least 3 times week (walk 1-mile in community) Write in journal during wind down time Set an alarm for 8:30pm as a reminder to write Establish and enforce healthy boundaries Conduct self-check-ins Implement positive self-talk Utilize scriptures as Academic librarian support system  Progress Notes:  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 Patient stated that she talked with Dr. Oval Linsey about various activities that she can engage in to increase physical activity. Patient reported that she has been walking one mile in her community approximately 3 times per week.   Patient mentioned that she must work on her motivation so that she can be consistent. Patient stated that she has discussed with her husband regarding getting a gym membership and believes that will be helpful in having support to keep her motivated to engage in physical activity.   Patient discussed the day that her blood pressure was elevated. Patient stated that she was stressed that day. However, patient mentioned that she tried exercising and eating well before increasing her blood pressure medications to manage stress and to lower her readings. Patient's blood pressure has returned to normal since implementing these steps.   Patient expressed that some of her stressors are maintaining healthy relationships and being able to invest time in caring for herself. Patient shared how she has been processing and revisioning relationships with others. Patient mentioned that this has been stressful for her because she finds herself over analyzing her thoughts and  thinking about why things are a certain way or how to problem solve.   Patient stated that she is focused on having peace within and with others. Patient reported that she spends times thinking about her relationships and how they are affecting her and how she will handle various situations that are occurring. Patient mentioned that she has found herself crying when processing her thoughts and emotions regarding these relationships.   Patient mentioned that she wants to refocus her energy on what's important because the process can be emotionally and mentally draining. Patient mentioned that a way she copes with her stressors is by incorporating her faith/spiritual practices. Patient uses an app for [email or web address] to find scriptures that relates to various situations she deals with.   Patient explained that she invests a lot of time and energy into helping others but do not does not implement this for herself. Patient would like to work on improving her self-care.     Coaching Outcomes: Patient will be emailed a copy of the health coaching Welcome Letter and Code of Ethics. Patient will be emailed information on writing in a journal, establishing and maintaining healthy boundaries, conducting self-check-ins, and implementing positive self-talk by incorporating scriptures as affirmations. Patient will continue to exercise at least three times a week (walk one mile in community).  Patient will also be emailed information on other stress management techniques such as deep breathing and mindfulness. Patient will utilize individuals that she has healthy relationships with in a support system.  Patient is interested in writing her prayers in addition to writing in her journal about her stressors. Patient will set an alarm for 8:30pm to remind herself to write in her journal. Patient will continue to use the app for [email or web address] to find scriptures that relate to friendships/relationships that can be used as affirmations  and guidance.

## 2021-07-19 NOTE — Telephone Encounter (Signed)
Called patient for scheduled health coaching session. Patient did not answer. Left a message for patient to return call to hold session or to reschedule.   Evelyn Aguinaldo Truman Hayward, [facility] Guide, Health Coach [address], [address] [place] [address] Telephone: [phone] Email: [email or web address]

## 2021-07-24 ENCOUNTER — Other Ambulatory Visit: Payer: Self-pay

## 2021-07-24 ENCOUNTER — Ambulatory Visit (INDEPENDENT_AMBULATORY_CARE_PROVIDER_SITE_OTHER): Payer: 59

## 2021-07-24 DIAGNOSIS — I10 Essential (primary) hypertension: Secondary | ICD-10-CM

## 2021-07-29 ENCOUNTER — Telehealth: Payer: Self-pay

## 2021-07-29 NOTE — Telephone Encounter (Signed)
Called to discuss PREP schedule at [facility], left voicemail

## 2021-07-29 NOTE — Telephone Encounter (Signed)
Returned my call, discussed PREP classes starting in Jan/Feb; she is traveling for work and very busy, so cannot commit right now, she will call me if her schedule changes and is able to attend in Feb, I will contact her in March when next classes start for March/April.

## 2021-08-01 DIAGNOSIS — I10 Essential (primary) hypertension: Secondary | ICD-10-CM | POA: Diagnosis not present

## 2021-08-02 ENCOUNTER — Telehealth: Payer: Self-pay

## 2021-08-02 ENCOUNTER — Ambulatory Visit: Payer: BLUE CROSS/BLUE SHIELD

## 2021-08-02 DIAGNOSIS — Z0000 Encounter for general adult medical examination without abnormal findings: Secondary | ICD-10-CM

## 2021-08-02 NOTE — Telephone Encounter (Signed)
Called patient for scheduled health coaching session. Patient was unable to participate in the session at this time and inquired if she would be able to call back after she tie up some loose ends with work. Patient will call back after 2:00pm today. If patient has not returned call at this time will call patient back by 4:00pm today.   Dawnita Molner Truman Hayward, [facility] Guide, Health Coach [address], [address] [place] [address] Telephone: [phone] Email: [email or web address]

## 2021-08-22 ENCOUNTER — Telehealth: Payer: Self-pay | Admitting: Pharmacist

## 2021-08-22 ENCOUNTER — Telehealth: Payer: Self-pay

## 2021-08-22 DIAGNOSIS — Z0000 Encounter for general adult medical examination without abnormal findings: Secondary | ICD-10-CM

## 2021-08-22 NOTE — Telephone Encounter (Signed)
Called patient because she indicated in [place] response that she did not have enough medication to last through the weekend. Patient stated that she answered the question that way because she need to pick up her medications. Patient did not express any financial barriers to picking up her medications.   Patient was asked if she would like to resume health coaching. Patient is interested in health coaching and has been scheduled for her next session on 2/3 at 4:30pm. Patient will be called at this time.   Patient needed to reschedule her pharmacy appointment. Spoke with Gerald Stabs in pharmacy and transferred patient.   Deunte Bledsoe Truman Hayward, [facility] Guide, Health Coach [address] [place] [address] Telephone: [phone] Email: [email or web address]

## 2021-08-22 NOTE — Telephone Encounter (Signed)
Called to assist pt w/r/s but they wanted a sooner date and we do not have anything available any time soon until march so they decided to hold off and keep current appt

## 2021-08-22 NOTE — Telephone Encounter (Signed)
Patient requests call to reschedule upcoming appointment

## 2021-08-23 ENCOUNTER — Other Ambulatory Visit: Payer: Self-pay

## 2021-08-23 ENCOUNTER — Ambulatory Visit (INDEPENDENT_AMBULATORY_CARE_PROVIDER_SITE_OTHER): Payer: BLUE CROSS/BLUE SHIELD

## 2021-08-23 DIAGNOSIS — Z0000 Encounter for general adult medical examination without abnormal findings: Secondary | ICD-10-CM

## 2021-08-23 NOTE — Progress Notes (Signed)
Appointment Outcome: Completed, Session #: 1 Start time: 4:34pm   End time: 5:06pm   Total Mins: 32 minutes  AGREEMENTS SECTION   Overall Goal(s): Stress management Increase physical activity                                           Agreement/Action Steps: Exercise at least 3 times week (walk 1-mile in community) Write in journal during wind down time Set an alarm for 8:30pm as a reminder to write Establish and enforce healthy boundaries Conduct self-check-ins Implement positive self-talk Utilize scriptures as Academic librarian support system  Progress Notes:  Patient rated her stress level a 4 out of 10. Patient stated that at this time, she is dealing with everyday responsibilities. Patient stated that she has been reading more to help manage her stress by using scriptures for positive self-talk/affirmations. Patient stated that her support group has been supportive. Patient and friends send scriptures to encourage each other through stressful times. Patient stated that she recognized that it's hard for her to know what her limits are. Patient stated that she had the tendency to push self beyond her limits.  Patient has established and is enforcing healthy boundaries. Patient stated this step has been going well. Patient stated that she has been adjusting to dealing with people in various relationships in a different way. Patient stated that she has been praying for health with enforcing boundaries. Patient stated that she has been practicing not saying yes, being okay with the boundaries, and is constantly working to maintain them.  Patient stated that writing in a journal and shared that it has been a helpful step. Patient stated that she runs to her journal when she is frustrated. Patient stated that she wants to be more consistent with writing on the days when she is not stressed. Patient mentioned that writing is a nice outlet for her. Patient stated that she read what she wrote  and reflect to help her reorganize her thoughts and process her emotions.   Patient expressed that she has had a difficult time walking outdoors consistently 3 times per week. Patient shared that her allergies kept her from walking as much as she would have liked and had to take allergy medications to help. Patient is trying to figure out plans for a gym membership. Patient is trying to find a reasonably priced membership. Patient stated that she has also been traveling between [place] for work, which eliminates time from her schedule to incorporate exercise. Patient mentioned that she has been looking on YouTube for videos and have found some by Feliberto Harts that she can follow along with. Patient stated that her schedule is more flexible on MWF.   Indicators of Success and Accountability:  Patient has been using scriptures for positive self-talk/affirmations. Patient has been utilizing her support system as well.  Readiness: Patient is in the action phase of stress management and increasing physical activity. Strengths and Supports: Patient is being supported by like-minded friends. Patient is focused on implementing self-care practices. Challenges and Barriers: Patient does not foresee any challenges to implementing her action steps over the next two weeks.   Coaching Outcomes: Patient stated that when she is stressed, she will continue to put herself in a different head space. Patient mentioned that she believes that it's okay to feel and she recognizes that she is not alone.  Patient will create  a schedule to add a time frame for exercising on MWF.   Patient will utilize workout videos from YouTube to follow along during her workouts.   Patient will start writing in her journal daily. Patient will write what she is grateful for or accomplished on days that she doesn't find herself stressed to help with the consistency of writing daily.   Attempted: Fulfilled - Patient has established and  is enforcing healthy boundaries. Patient is conducting self-check-ins and implementing positive self-talk/affirmation using scriptures. Patient is utilizing her support system. Partial - Patient has been walking with her husband but not consistently for 3 days per week.

## 2021-08-28 ENCOUNTER — Ambulatory Visit (HOSPITAL_BASED_OUTPATIENT_CLINIC_OR_DEPARTMENT_OTHER): Payer: BLUE CROSS/BLUE SHIELD | Admitting: Cardiovascular Disease

## 2021-08-31 DIAGNOSIS — I10 Essential (primary) hypertension: Secondary | ICD-10-CM | POA: Diagnosis not present

## 2021-09-04 ENCOUNTER — Ambulatory Visit: Payer: BLUE CROSS/BLUE SHIELD

## 2021-09-06 ENCOUNTER — Other Ambulatory Visit: Payer: Self-pay

## 2021-09-06 ENCOUNTER — Ambulatory Visit (INDEPENDENT_AMBULATORY_CARE_PROVIDER_SITE_OTHER): Payer: BLUE CROSS/BLUE SHIELD

## 2021-09-06 DIAGNOSIS — Z0000 Encounter for general adult medical examination without abnormal findings: Secondary | ICD-10-CM

## 2021-09-06 NOTE — Progress Notes (Signed)
Appointment Outcome: Completed, Session #: 2 Start time: 4:31pm   End time: 5:04pm   Total Mins: 33 minutes  AGREEMENTS SECTION  Overall Goal(s): Stress management Increase physical activity                                           Agreement/Action Steps: Exercise at least 3 times week (walk 1-mile in community) Write in journal during wind down time Set an alarm for 8:30pm as a reminder to write Establish and enforce healthy boundaries Conduct self-check-ins Implement positive self-talk Utilize scriptures as Academic librarian support system  Progress Notes:  Patient stated that she has been able to exercise three times a week at [employer] since she has a membership there. Patient stated that she has worked out on Tuesdays, twice on Wednesdays, and Fridays. Patient is working on a schedule that she can work out with her husband. Patient mentioned that she doesn't like going to the gym late in the day and that it helps a lot to go in the morning. Patient reported that exercising during this time of day helps her to feel better, she doesn't feel as fatigued during the day, and it sets the tone for her mentally/emotionally.   Patient stated that she only wrote in her journal once in the past two weeks. Patient shared that she has focused more on establishing a workout routine since the last session. Patient mentioned that she has also been trying to go to bed earlier so that she can get to the gym by 6:00am, which interferes with the time she winds down for the evening. Patient is implementing getting more sleep so she can maintain her work out schedule. Patient feels that writing in a journal is helpful because she is constantly reflecting mentally and getting things down on paper may help to free up mental space.  Patient shared that when dealing with others, she has had to conduct self-check-ins and implement positive self-talk to manage stress. Patient stated that enforcing her  boundaries has been the most helpful in managing stress. Patient has done a lot of mental reflecting on how these boundaries are affecting her and determining how she can be okay with maintaining these boundaries. Patient expressed that maintaining boundaries is a work in progress. Patient finds with each step of enforcing and maintaining boundaries has its own set of challenges. Patient is recognizing that her boundaries are not the same boundaries that others hold.   Patient's perspective is changing in relation to healthy boundaries to where she is motivating herself, setting boundaries for herself, and putting the same energy into herself as she would otherwise do for others. Patient expressed that she has been standing up for herself more. Patient also shared that using the scriptures as affirmations is a reminder that she is not alone. Patient has utilized her support system in the past two weeks by engaging in conversations with her husband and mother.   Indicators of Success and Accountability:  Patient has been able to enforce and maintain healthy boundaries as her indicator of success and accountability.  Readiness: Patient is in the action phase of stress management and increasing physical activity.   Strengths and Supports: Patient is being supported by her husband and family. Patient strength is her faith to be able to implement these changes.  Challenges and Barriers: Patient does not foresee any challenges/barriers to implementing  her action steps over the next two weeks.   Coaching Outcomes: Patient is interested in writing in her journal at least twice a week. Patient will write about her stressors and research a scripture that pertains to her situation that she finds encouraging to aid in dealing with various concerns.   Patient will implement the following action steps outlined below over the next two weeks.    Agreement/Action Steps: Exercise at least 3 times week (Tuesday,  Wednesday, and Friday) Write in journal during wind down time Set an alarm for 8:30pm as a reminder to write Establish and enforce healthy boundaries Conduct self-check-ins Implement positive self-talk Utilize scriptures as Academic librarian support system   Attempted: Fulfilled - Patient completed the weekly agreement in full and was able to meet the challenge.

## 2021-09-20 ENCOUNTER — Ambulatory Visit: Payer: 59

## 2021-09-20 ENCOUNTER — Telehealth: Payer: Self-pay

## 2021-09-20 DIAGNOSIS — Z0000 Encounter for general adult medical examination without abnormal findings: Secondary | ICD-10-CM

## 2021-09-20 NOTE — Telephone Encounter (Signed)
Called patient to hold health coaching session as scheduled. Patient did not answer. Left a message for the patient to return call to hold session or to reschedule. ? ? ?Kelly Carney, [name] ?[facility] ?Care Guide, Health Coach ?[address] ?[place] [address] ?Telephone: [phone] ?Email: [email or web address] ? ?

## 2021-09-30 DIAGNOSIS — I10 Essential (primary) hypertension: Secondary | ICD-10-CM | POA: Diagnosis not present

## 2021-10-02 ENCOUNTER — Ambulatory Visit: Payer: 59

## 2021-10-28 ENCOUNTER — Ambulatory Visit: Payer: 59

## 2021-10-30 DIAGNOSIS — I10 Essential (primary) hypertension: Secondary | ICD-10-CM | POA: Diagnosis not present

## 2021-11-06 ENCOUNTER — Ambulatory Visit (HOSPITAL_BASED_OUTPATIENT_CLINIC_OR_DEPARTMENT_OTHER): Payer: BLUE CROSS/BLUE SHIELD | Admitting: Cardiovascular Disease

## 2021-11-07 ENCOUNTER — Ambulatory Visit (HOSPITAL_BASED_OUTPATIENT_CLINIC_OR_DEPARTMENT_OTHER): Payer: BLUE CROSS/BLUE SHIELD | Admitting: Cardiovascular Disease

## 2021-12-18 ENCOUNTER — Other Ambulatory Visit: Payer: Self-pay | Admitting: Internal Medicine

## 2021-12-18 DIAGNOSIS — D352 Benign neoplasm of pituitary gland: Secondary | ICD-10-CM

## 2021-12-20 ENCOUNTER — Telehealth: Payer: Self-pay

## 2021-12-20 NOTE — Telephone Encounter (Signed)
Received call from MRI schedulers regarding pt being scheduled for MRI with contrast and pt reporting she had tingling of her face after her last MRI. I called and spoke to the patient and she stated her face began to tingle right after the MRI, only lasting for a few minutes, and it subsided without any intervention. Pt denied having rash, itching, hives, SOB. Per Dr. Jobe Igo, this pt does not need a full 13 hr prep and she should take 50 mg of benadryl 1 hr prior to appt time. Pt was instructed of this, stating she had benadryl at home. Pt was also advised to have a driver the day of taking benadryl as it may cause drowsiness. Pt verbalized understanding.

## 2022-01-01 ENCOUNTER — Ambulatory Visit
Admission: RE | Admit: 2022-01-01 | Discharge: 2022-01-01 | Disposition: A | Discharge status: Home / Self Care | Payer: 59 | Source: Ambulatory Visit | Attending: Internal Medicine | Admitting: Internal Medicine

## 2022-01-01 DIAGNOSIS — D352 Benign neoplasm of pituitary gland: Secondary | ICD-10-CM

## 2022-01-01 MED ORDER — GADOBENATE DIMEGLUMINE 529 MG/ML IV SOLN
8.0000 mL | Freq: Once | INTRAVENOUS | Status: AC | PRN
  Administered 2022-01-01: 8 mL via INTRAVENOUS

## 2022-01-10 ENCOUNTER — Telehealth: Payer: Self-pay

## 2022-01-10 DIAGNOSIS — Z0000 Encounter for general adult medical examination without abnormal findings: Secondary | ICD-10-CM

## 2022-02-05 ENCOUNTER — Telehealth: Payer: Self-pay

## 2022-02-05 DIAGNOSIS — Z0000 Encounter for general adult medical examination without abnormal findings: Secondary | ICD-10-CM
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
## 2022-02-06 ENCOUNTER — Telehealth: Payer: Self-pay | Admitting: Licensed Clinical Social Worker
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
## 2022-02-07 ENCOUNTER — Telehealth: Payer: Self-pay | Admitting: Licensed Clinical Social Worker

## 2022-02-07 NOTE — Telephone Encounter (Signed)
H&V Care Navigation CSW Progress Note  Clinical Social Worker contacted patient by phone to f/u for a second time for resources. No answer again at [phone]. Left second voicemail for pt with my contact information should she be interested in engaging with assistance.   Patient is participating in a Managed Medicaid Plan:  No, Banker only.   SDOH Screenings   Alcohol Screen: Low Risk  (07/02/2021)   Alcohol Screen    Last Alcohol Screening Score (AUDIT): 2  Depression (PHQ2-9): Not on file  Financial Resource Strain: Low Risk  (07/02/2021)   Overall Financial Resource Strain (CARDIA)    Difficulty of Paying Living Expenses: Not hard at all  Food Insecurity: No Food Insecurity (07/02/2021)   Hunger Vital Sign    Worried About Running Out of Food in the Last Year: Never true    Ran Out of Food in the Last Year: Never true  Housing: Low Risk  (07/02/2021)   Housing    Last Housing Risk Score: 0  Physical Activity: Inactive (07/02/2021)   Exercise Vital Sign    Days of Exercise per Week: 0 days    Minutes of Exercise per Session: 0 min  Social Connections: Not on file  Stress: Not on file  Tobacco Use: Low Risk  (07/02/2021)   Patient History    Smoking Tobacco Use: Never    Smokeless Tobacco Use: Never    Passive Exposure: Not on file  Transportation Needs: No Transportation Needs (07/02/2021)   PRAPARE - Transportation    Lack of Transportation (Medical): No    Lack of Transportation (Non-Medical): No   Westley Hummer, MSW, [place]  [phone]- work cell phone (preferred) [phone]- desk phone

## 2022-02-10 ENCOUNTER — Telehealth: Payer: Self-pay | Admitting: Licensed Clinical Social Worker

## 2022-02-10 NOTE — Progress Notes (Signed)
Heart and Vascular Care Navigation  02/10/2022  [place] 1980/08/25 [id number]  Reason for Referral:  Patient is participating in a Managed Medicaid Plan: No, commercial insurance only  Engaged with patient by telephone for initial visit for Heart and Vascular Care Coordination.                                                                                                   Assessment:                                      HRT/VAS Care Coordination     Patients Home Cardiology Office [place] Team Social Worker   Social Worker Name: Valeda Malm, [place] [phone]   Living arrangements for the past 2 months Single Family Home   Lives with: Parents   Patient Current Insurance Coverage Commercial Insurance   Patient Has Concern With Paying Medical Bills No   Does Patient Have Prescription Coverage? Yes       Social History:                                                                             SDOH Screenings   Alcohol Screen: Low Risk  (07/02/2021)   Alcohol Screen    Last Alcohol Screening Score (AUDIT): 2  Depression (PHQ2-9): Not on file  Financial Resource Strain: Low Risk  (02/10/2022)   Overall Financial Resource Strain (CARDIA)    Difficulty of Paying Living Expenses: Not very hard  Food Insecurity: No Food Insecurity (02/10/2022)   Hunger Vital Sign    Worried About Running Out of Food in the Last Year: Never true    Ran Out of Food in the Last Year: Never true  Housing: Low Risk  (02/10/2022)   Housing    Last Housing Risk Score: 0  Physical Activity: Inactive (07/02/2021)   Exercise Vital Sign    Days of Exercise per Week: 0 days    Minutes of Exercise per Session: 0 min  Social Connections: Not on file  Stress: Not on file  Tobacco Use: Low Risk  (07/02/2021)   Patient History    Smoking Tobacco Use: Never    Smokeless Tobacco Use: Never    Passive Exposure: Not on file  Transportation  Needs: No Transportation Needs (02/10/2022)   PRAPARE - Transportation    Lack of Transportation (Medical): No    Lack of Transportation (Non-Medical): No    SDOH Interventions: Financial Resources:  Sales promotion account executive Interventions: Intervention Not Indicated  Food Insecurity:  Food Insecurity Interventions: Intervention Not Indicated  Housing Insecurity:  Housing Interventions: Intervention Not Indicated  Transportation:   Transportation Interventions: Intervention Not Indicated   Health Promotion  Interventions:     Physical Inactivity {CHL AMB HRT/VAS OTHER REFERRALS:23729}  Smoking Cessation {CHL AMB HRT/VAS SMOKING CESSATON:23731}  Dietary Concerns ***  Health Coaching {CHL AMB HRT/VAS HEALTH COACHING:23732}   Other Care Navigation Interventions:     Patient expressed Mental Health concerns Yes, Referred to:  Feliz Beam, [facility],  [facility], and    Follow-up plan:

## 2022-03-13 ENCOUNTER — Telehealth (INDEPENDENT_AMBULATORY_CARE_PROVIDER_SITE_OTHER): Payer: 59 | Admitting: Cardiovascular Disease

## 2022-03-13 ENCOUNTER — Encounter (HOSPITAL_BASED_OUTPATIENT_CLINIC_OR_DEPARTMENT_OTHER): Payer: Self-pay | Admitting: Cardiovascular Disease

## 2022-03-13 DIAGNOSIS — I10 Essential (primary) hypertension: Secondary | ICD-10-CM

## 2022-03-13 MED ORDER — AMLODIPINE BESYLATE 10 MG PO TABS: 10.0000 mg | ORAL_TABLET | Freq: Every day | ORAL | 3 refills | Status: DC

## 2022-03-13 NOTE — Progress Notes (Signed)
Virtual Visit via Video Note   Because of Kelly Carney's co-morbid illnesses, she is at least at moderate risk for complications without adequate follow up.  This format is felt to be most appropriate for this patient at this time.  All issues noted in this document were discussed and addressed.  A limited physical exam was performed with this format.  Please refer to the patient's chart for her consent to telehealth for [facility].  The patient was identified using 2 identifiers.  Date:  03/14/2022   ID:  Kelly Carney, DOB 1980-12-07, MRN [id number]  Patient Location: Home Provider Location: Office/Clinic  PCP:  Harlan Stains, MD  Cardiologist:  None  Electrophysiologist:  None   Evaluation Performed:  Follow-Up Visit  Chief Complaint:  Hypertension   History of Present Illness:    The patient does not have symptoms concerning for COVID-19 infection (fever, chills, cough, or new shortness of breath).   Kelly Carney is a 41 y.o. 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 She was initially seen 07/02/2021 in the [facility].  She saw Dr. Garwin Brothers on 01/2021 and her blood pressure was 139/90 on amlodipine.  There was concerned about her early onset of hypertension and was she was referred for secondary work-up.  Her blood pressure became elevated in 2020 around the time of the pandemic. Amlodipine was increased at her initial visit and she was referred to the PREP program. Renal dopplers were normal 07/2021. She enrolled in the [place] remote patient monitoring study. Her blood pressure has been averaging in the 120-140s over 70-80s and typically is in the 120/80s.  Today, she has some increased stress from moving back in with her parents. Medications seem to be working well, since she is having no issues with edema or anything else.  She did mention that when she first was referred she had a couple episodes where her blood  pressure dropped to the 74Y systolic. Her stress level contributes a lot to whether or not her blood pressure levels are high or fluctuate. Generally she is taking her medication at night before bed. Though she takes it before bed, it's not consistently at the same time every night. She is not working out as often as when she first moved here but she has been occasionally going to [employer] to work out as well as doing exercises at home when she can. She plans to work on increasing her exercise and return to a tight regimen. During activity she is feeling great with no symptoms. She denies any palpitations, chest pain, shortness of breath, or peripheral edema. No lightheadedness, headaches, syncope, orthopnea, or PND.  Previous antihypertensives: Amlodipine   Past Medical History:  Diagnosis Date   Essential hypertension 07/02/2021   Urticaria     Past Surgical History:  Procedure Laterality Date   NO PAST SURGERIES      Current Medications: Current Meds  Medication Sig   cabergoline (DOSTINEX) 0.5 MG tablet Take 0.5 mg by mouth once a week.   Loratadine 10 MG CAPS Take 1 tablet by mouth daily.   Multiple Vitamins-Minerals (HAIR SKIN AND NAILS FORMULA PO) Take 1 capsule by mouth daily.   Multiple Vitamins-Minerals (VITAMIN D3 COMPLETE) TABS Take 5,000 Units by mouth daily.   sertraline (ZOLOFT) 100 MG tablet Take 150 mg by mouth daily.   [DISCONTINUED] amLODipine (NORVASC) 10 MG tablet Take 1 tablet (10 mg total) by mouth daily.  Allergies:   Cabergoline and Multihance [gadobenate]   Social History   Socioeconomic History   Marital status: Married    Spouse name: Not on file   Number of children: Not on file   Years of education: Not on file   Highest education level: Not on file  Occupational History   Not on file  Tobacco Use   Smoking status: Never   Smokeless tobacco: Never  Substance and Sexual Activity   Alcohol use: Yes   Drug use: No   Sexual activity:  Yes    Birth control/protection: Pill  Other Topics Concern   Not on file  Social History Narrative   Not on file   Social Determinants of Health   Financial Resource Strain: Low Risk  (02/10/2022)   Overall Financial Resource Strain (CARDIA)    Difficulty of Paying Living Expenses: Not very hard  Food Insecurity: No Food Insecurity (02/10/2022)   Hunger Vital Sign    Worried About Running Out of Food in the Last Year: Never true    Ran Out of Food in the Last Year: Never true  Transportation Needs: No Transportation Needs (02/10/2022)   PRAPARE - Hydrologist (Medical): No    Lack of Transportation (Non-Medical): No  Physical Activity: Inactive (07/02/2021)   Exercise Vital Sign    Days of Exercise per Week: 0 days    Minutes of Exercise per Session: 0 min  Stress: Not on file  Social Connections: Not on file     Family History: The patient's family history includes Hypertension in her maternal grandmother and mother; Kidney disease in her maternal grandmother; Other in her father and sister; Sarcoidosis in her father and sister. There is no history of Allergic rhinitis, Angioedema, Asthma, Eczema, Immunodeficiency, or Urticaria.  ROS:   Please see the history of present illness.    (+) Stress All other systems reviewed and are negative.  EKGs/Labs/Other Studies Reviewed:    Bilateral Renal Artery Doppler 07/24/2021: Summary:  Largest Aortic Diameter: 1.7 cm     Renal:     Right: Normal size right kidney. Normal right Resisitive Index.         Normal cortical thickness of right kidney. No evidence of         right renal artery stenosis. RRV flow present.  Left:  Normal size of left kidney. Normal left Resistive Index.         Normal cortical thickness of the left kidney. No evidence of         left renal artery stenosis. LRV flow present.  Mesenteric:  Normal Celiac artery and Superior Mesenteric artery findings.      EKG:  EKG is  personally reviewed. 03/13/2022: EKG was not ordered.  07/02/2022: sinus rhythm.  Rate 70 bpm.  Recent Labs: No results found for requested labs within last 365 days.   Recent Lipid Panel No results found for: "CHOL", "TRIG", "HDL", "CHOLHDL", "VLDL", "LDLCALC", "LDLDIRECT"  Physical Exam:    BP 129/89   Pulse 74   Ht '5\' 4"'$  (1.626 m)   Wt 157 lb (71.2 kg)   BMI 26.95 kg/m  GENERAL: Well-appearing.  No acute distress. HEENT: Pupils equal round.  Oral mucosa unremarkable NECK:  No jugular venous distention, no visible thyromegaly EXT:  No edema, no cyanosis no clubbing SKIN:  No rashes no nodules NEURO:  Speech fluent.  Cranial nerves grossly intact.  Moves all 4 extremities freely PSYCH:  Cognitively intact, oriented  to person place and time   ASSESSMENT/PLAN:    Essential hypertension Blood pressures are nearly at goal.  She is exercising and working on stress management.  Continue with amlodipine '10mg'$  for now.  She will keep working on lifestyle modification.  If her blood pressures remain above goal at follow-up we will need to add a low-dose of a secondary agent.  Refer to the PREP program at the [facility].  Screening for Secondary Hypertension:     07/02/2021    9:47 AM  Causes  Drugs/Herbals Screened     - Comments limits salt and carbs.  1 coffee daily.  no significant supplemets  Renovascular HTN Screened     - Comments Check renal artery Dopplers  Sleep Apnea Screened     - Comments snores.  No apnea.  Not always rested.  Feels fatigued during the day  Thyroid Disease Screened  Hyperaldosteronism N/A  Pheochromocytoma N/A  Cushing's Syndrome N/A  Hyperparathyroidism Screened     - Comments calcium wnl  Coarctation of the Aorta Screened     - Comments BP intially asymetric but not on repeat.  Pulses are symmetric  Compliance Screened    Relevant Labs/Studies:       Latest Ref Rng & Units 11/23/2015    8:56 AM  Thyroid   TSH 0.450 - 4.500 uIU/mL 1.520                  07/24/2021    8:45 AM  Renovascular   Renal Artery [place] Completed Yes     COVID-19 Education: The signs and symptoms of COVID-19 were discussed with the patient and how to seek care for testing (follow up with PCP or arrange E-visit).  The importance of social distancing was discussed today.  Time:   Today, I have spent 17 minutes with the patient with telehealth technology discussing the above problems.    Disposition:    FU with Decorian Schuenemann C. Oval Linsey, MD, [facility] in 6 months   Medication Adjustments/Labs and Tests Ordered: Current medicines are reviewed at length with the patient today.  Concerns regarding medicines are outlined above.  Orders Placed This Encounter  Procedures   Amb Referral To Provider Referral Exercise Program (P.R.E.P)    Meds ordered this encounter  Medications   amLODipine (NORVASC) 10 MG tablet    Sig: Take 1 tablet (10 mg total) by mouth daily.    Dispense:  90 tablet    Refill:  3    I,Jessica Ford,acting as a Education administrator for [place], MD.,have documented all relevant documentation on the behalf of Skeet Latch, MD,as directed by  Skeet Latch, MD while in the presence of Skeet Latch, MD.   I, [place] Oval Linsey, MD have reviewed all documentation for this visit.  The documentation of the exam, diagnosis, procedures, and orders on 03/14/2022 are all accurate and complete.    Signed, Skeet Latch, MD  03/14/2022 1:45 PM    [facility]

## 2022-03-13 NOTE — Assessment & Plan Note (Addendum)
Blood pressures are nearly at goal.  She is exercising and working on stress management.  Continue with amlodipine '10mg'$  for now.  She will keep working on lifestyle modification.  If her blood pressures remain above goal at follow-up we will need to add a low-dose of a secondary agent.  Refer to the PREP program at the [facility].

## 2022-03-13 NOTE — Patient Instructions (Signed)
Medication Instructions:  Your physician recommends that you continue on your current medications as directed. Please refer to the Current Medication list given to you today.   Labwork: NONE   Testing/Procedures: NONE   Follow-Up: 09/08/2022 1:30 PM DR [place]   Any Other Special Instructions Will Be Listed Below (If Applicable).  PAM OR LORA FROM THE PREP PROGRAM WILL BE IN TOUCH  IF YOU DO NOT HEAR FROM THEM IN 2 WEEKS CALL THE OFFICE TO FOLLOW UP

## 2022-03-14 ENCOUNTER — Encounter (HOSPITAL_BASED_OUTPATIENT_CLINIC_OR_DEPARTMENT_OTHER): Payer: Self-pay | Admitting: Cardiovascular Disease

## 2022-03-17 ENCOUNTER — Telehealth: Payer: Self-pay | Admitting: *Deleted

## 2022-03-17 NOTE — Telephone Encounter (Signed)
Patient contacted for PREP class referral. She is interested in attending at [facility] for next class to be scheduled in December 2023.

## 2022-03-21 ENCOUNTER — Telehealth: Payer: Self-pay

## 2022-03-21 DIAGNOSIS — Z006 Encounter for examination for normal comparison and control in clinical research program: Secondary | ICD-10-CM

## 2022-03-21 NOTE — Telephone Encounter (Signed)
Attempted to call pt to complete Cantril's Ladder and pt survey. Pt is enrolled in Dr. Blenda Mounts HTN Virtual Trial in Group 2. I was unable to leave a message due to pt's mailbox being full. I will attempt to call pt back next week.

## 2022-04-02 ENCOUNTER — Encounter (HOSPITAL_BASED_OUTPATIENT_CLINIC_OR_DEPARTMENT_OTHER): Payer: Self-pay

## 2022-06-23 ENCOUNTER — Telehealth: Payer: Self-pay

## 2022-06-23 NOTE — Telephone Encounter (Signed)
Called to discuss PREP class schedule at [facility] in December; left voicemail

## 2022-07-24 ENCOUNTER — Telehealth: Payer: Self-pay

## 2022-07-24 NOTE — Telephone Encounter (Signed)
Called to discuss PREP program referral, mailbox full, cannot leave message

## 2022-09-08 ENCOUNTER — Ambulatory Visit (HOSPITAL_BASED_OUTPATIENT_CLINIC_OR_DEPARTMENT_OTHER): Payer: 59 | Admitting: Cardiovascular Disease

## 2022-09-08 ENCOUNTER — Encounter (HOSPITAL_BASED_OUTPATIENT_CLINIC_OR_DEPARTMENT_OTHER): Payer: Self-pay | Admitting: Cardiovascular Disease

## 2022-09-08 VITALS — BP 142/90 | HR 81 | Ht 64.0 in | Wt 156.3 lb

## 2022-09-08 DIAGNOSIS — Z5181 Encounter for therapeutic drug level monitoring: Secondary | ICD-10-CM | POA: Diagnosis not present

## 2022-09-08 DIAGNOSIS — I10 Essential (primary) hypertension: Secondary | ICD-10-CM

## 2022-09-08 MED ORDER — SPIRONOLACTONE 25 MG PO TABS
25.0000 mg | ORAL_TABLET | Freq: Every day | ORAL | 3 refills | Status: DC
Start: 2022-09-08 — End: 2023-07-20

## 2022-09-08 NOTE — Progress Notes (Signed)
Advanced Hypertension Clinic Follow-up:    Date:  09/08/2022   ID:  Kelly Carney, DOB 08-Oct-1980, MRN [id number]  PCP:  Harlan Stains, MD  Cardiologist:  None  Nephrologist:  Referring MD: Harlan Stains, MD   CC: Hypertension  History of Present Illness:    Kelly Carney is a 42 y.o. 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 She was initially seen 07/02/2021 in the [facility].  She saw Dr. Garwin Brothers on 01/2021 and her blood pressure was 139/90 on amlodipine.  There was concerned about her early onset of hypertension and was she was referred for secondary work-up.  Her blood pressure became elevated in 2020 around the time of the pandemic. Amlodipine was increased at her initial visit and she was referred to the PREP program. Renal dopplers were normal 07/2021. She enrolled in the [place] remote patient monitoring study. Her blood pressure has been averaging in the 120-140s over 70-80s and typically is in the 120/80s.  At the last appointment she was under stress after moving in with her parents. Diastolic blood pressure was mildly elevated. She wanted to keep working on diet and exercise and was referred to the PREP program. They were unable to reach her due to a full voicemail. Today, her main complaint is feeling stressed. She continues to participate in therapy for chronic PTSD which has helped. At home she has fallen out of the habit of monitoring her blood pressure, but remains compliant with her antihypertensives every night. For exercise, she is walking 5 miles two to three times a week. She also performs some weight lifting at home. She is doing well with watching her diet and salt intake. Generally she eats less meat. Every day she typically eats 1 meal a day due to not feeling hungry. She denies any palpitations, chest pain, shortness of breath, or peripheral edema. No lightheadedness, headaches, syncope, orthopnea, or PND.  Previous  antihypertensives: Amlodipine   Past Medical History:  Diagnosis Date   Essential hypertension 07/02/2021   Urticaria     Past Surgical History:  Procedure Laterality Date   NO PAST SURGERIES      Current Medications: Current Meds  Medication Sig   amLODipine (NORVASC) 10 MG tablet Take 1 tablet (10 mg total) by mouth daily.   cabergoline (DOSTINEX) 0.5 MG tablet Take 0.5 mg by mouth 2 (two) times a week.   clindamycin-benzoyl peroxide (BENZACLIN) gel Apply 1 Application topically every morning.   famotidine (PEPCID) 10 MG tablet Take 10 mg by mouth daily.   Loratadine 10 MG CAPS Take 1 tablet by mouth daily.   Multiple Vitamins-Minerals (HAIR SKIN AND NAILS FORMULA PO) Take 1 capsule by mouth daily.   Multiple Vitamins-Minerals (VITAMIN D3 COMPLETE) TABS Take 5,000 Units by mouth daily.   sertraline (ZOLOFT) 100 MG tablet Take 200 mg by mouth daily.   spironolactone (ALDACTONE) 25 MG tablet Take 1 tablet (25 mg total) by mouth daily.   tretinoin (RETIN-A) 0.025 % cream Apply topically at bedtime.     Allergies:   Cabergoline and Multihance [gadobenate]   Social History   Socioeconomic History   Marital status: Married    Spouse name: Not on file   Number of children: Not on file   Years of education: Not on file   Highest education level: Not on file  Occupational History   Not on file  Tobacco Use   Smoking status: Never   Smokeless tobacco: Never  Substance and Sexual Activity   Alcohol use: Yes   Drug use: No   Sexual activity: Yes    Birth control/protection: Pill  Other Topics Concern   Not on file  Social History Narrative   Not on file   Social Determinants of Health   Financial Resource Strain: Low Risk  (02/10/2022)   Overall Financial Resource Strain (CARDIA)    Difficulty of Paying Living Expenses: Not very hard  Food Insecurity: No Food Insecurity (02/10/2022)   Hunger Vital Sign    Worried About Running Out of Food in the Last Year: Never true     Ran Out of Food in the Last Year: Never true  Transportation Needs: No Transportation Needs (02/10/2022)   PRAPARE - Hydrologist (Medical): No    Lack of Transportation (Non-Medical): No  Physical Activity: Inactive (07/02/2021)   Exercise Vital Sign    Days of Exercise per Week: 0 days    Minutes of Exercise per Session: 0 min  Stress: Not on file  Social Connections: Not on file    Family History: The patient's family history includes Hypertension in her maternal grandmother and mother; Kidney disease in her maternal grandmother; Other in her father and sister; Sarcoidosis in her father and sister. There is no history of Allergic rhinitis, Angioedema, Asthma, Eczema, Immunodeficiency, or Urticaria.  ROS:   Please see the history of present illness.    (+) Stress (+) Loss of appetite All other systems reviewed and are negative.  EKGs/Labs/Other Studies Reviewed:    Bilateral Renal Artery Doppler 07/24/2021: Summary:  Largest Aortic Diameter: 1.7 cm     Renal:     Right: Normal size right kidney. Normal right Resisitive Index.         Normal cortical thickness of right kidney. No evidence of         right renal artery stenosis. RRV flow present.  Left:  Normal size of left kidney. Normal left Resistive Index.         Normal cortical thickness of the left kidney. No evidence of         left renal artery stenosis. LRV flow present.  Mesenteric:  Normal Celiac artery and Superior Mesenteric artery findings.      EKG:  EKG is personally reviewed. 09/08/2022:  Sinus rhythm. Rate 81 bpm. 07/02/2022: sinus rhythm.  Rate 70 bpm. 03/13/2022: EKG was not ordered.    Recent Labs: No results found for requested labs within last 365 days.   Recent Lipid Panel No results found for: "CHOL", "TRIG", "HDL", "CHOLHDL", "VLDL", "LDLCALC", "LDLDIRECT"  Physical Exam:    VS:  BP (!) 142/90 (BP Location: Right Arm, Patient Position: Sitting, Cuff Size:  Normal)   Pulse 81   Ht 5' 4"$  (1.626 m)   Wt 156 lb 4.8 oz (70.9 kg)   BMI 26.83 kg/m  , BMI Body mass index is 26.83 kg/m. GENERAL:  Well appearing HEENT: Pupils equal round and reactive, fundi not visualized, oral mucosa unremarkable NECK:  No jugular venous distention, waveform within normal limits, carotid upstroke brisk and symmetric, no bruits, no thyromegaly LUNGS:  Clear to auscultation bilaterally HEART:  RRR.  PMI not displaced or sustained,S1 and S2 within normal limits, no S3, no S4, no clicks, no rubs, no murmurs ABD:  Flat, positive bowel sounds normal in frequency in pitch, no bruits, no rebound, no guarding, no midline pulsatile mass, no hepatomegaly, no splenomegaly EXT:  2 plus pulses throughout, no  edema, no cyanosis no clubbing SKIN:  No rashes no nodules NEURO:  Cranial nerves II through XII grossly intact, motor grossly intact throughout PSYCH:  Cognitively intact, oriented to person place and time   ASSESSMENT/PLAN:    Essential hypertension Blood pressure remains above goal.  She is working hard on blood pressure control through exercise and diet.  Continue amlodipine.  Add spironolactone 25 mg daily.  Check a BMP.  Blood pressure goal is less than 130/80.  Hopefully as her last test is improved we will be able to wean her medications.  Screening for Secondary Hypertension:     07/02/2021    9:47 AM  Causes  Drugs/Herbals Screened     - Comments limits salt and carbs.  1 coffee daily.  no significant supplemets  Renovascular HTN Screened     - Comments Check renal artery Dopplers  Sleep Apnea Screened     - Comments snores.  No apnea.  Not always rested.  Feels fatigued during the day  Thyroid Disease Screened  Hyperaldosteronism N/A  Pheochromocytoma N/A  Cushing's Syndrome N/A  Hyperparathyroidism Screened     - Comments calcium wnl  Coarctation of the Aorta Screened     - Comments BP intially asymetric but not on repeat.  Pulses are symmetric   Compliance Screened    Relevant Labs/Studies:       Latest Ref Rng & Units 11/23/2015    8:56 AM  Thyroid   TSH 0.450 - 4.500 uIU/mL 1.520                 07/24/2021    8:45 AM  Renovascular   Renal Artery [place] Completed Yes     Disposition:    FU with Nelta Caudill C. Oval Linsey, MD, [facility] in 1-2 months.  Medication Adjustments/Labs and Tests Ordered: Current medicines are reviewed at length with the patient today.  Concerns regarding medicines are outlined above.   Orders Placed This Encounter  Procedures   Basic metabolic panel   EKG [id number]   Meds ordered this encounter  Medications   spironolactone (ALDACTONE) 25 MG tablet    Sig: Take 1 tablet (25 mg total) by mouth daily.    Dispense:  90 tablet    Refill:  3   I,Mathew Stumpf,acting as a scribe for Skeet Latch, MD.,have documented all relevant documentation on the behalf of Skeet Latch, MD,as directed by  Skeet Latch, MD while in the presence of Skeet Latch, MD.  I, [place] Oval Linsey, MD have reviewed all documentation for this visit.  The documentation of the exam, diagnosis, procedures, and orders on 09/08/2022 are all accurate and complete.  Signed, Skeet Latch, MD  09/08/2022 5:18 PM    [facility]

## 2022-09-08 NOTE — Assessment & Plan Note (Signed)
Blood pressure remains above goal.  She is working hard on blood pressure control through exercise and diet.  Continue amlodipine.  Add spironolactone 25 mg daily.  Check a BMP.  Blood pressure goal is less than 130/80.  Hopefully as her last test is improved we will be able to wean her medications.

## 2022-09-08 NOTE — Patient Instructions (Signed)
Medication Instructions:  START SPIRONOLACTONE 25 MG DAILY   Labwork: BMET IN 1 WEEK   Testing/Procedures: NONE   Follow-Up: 10/22/2022 1:30 PM WITH PHARM D AT [facility] LOC[facility]   Any Other Special Instructions Will Be Listed Below (If Applicable).     If you need a refill on your cardiac medications before your next appointment, please call your pharmacy.

## 2022-09-20 LAB — BASIC METABOLIC PANEL
BUN/Creatinine Ratio: 7 — ABNORMAL LOW (ref 9–23)
BUN: 7 mg/dL (ref 6–24)
CO2: 21 mmol/L (ref 20–29)
Calcium: 9.9 mg/dL (ref 8.7–10.2)
Chloride: 102 mmol/L (ref 96–106)
Creatinine, Ser: 0.97 mg/dL (ref 0.57–1.00)
Glucose: 80 mg/dL (ref 70–99)
Potassium: 4.4 mmol/L (ref 3.5–5.2)
Sodium: 139 mmol/L (ref 134–144)
eGFR: 76 mL/min/{1.73_m2} (ref >59)

## 2022-10-22 ENCOUNTER — Ambulatory Visit: Payer: 59 | Attending: Cardiology | Admitting: Pharmacist Clinician (PhC)/ Clinical Pharmacy Specialist

## 2022-10-22 ENCOUNTER — Encounter: Payer: Self-pay | Admitting: Pharmacist Clinician (PhC)/ Clinical Pharmacy Specialist

## 2022-10-22 VITALS — BP 119/78 | HR 78 | Ht 66.0 in | Wt 155.0 lb

## 2022-10-22 DIAGNOSIS — I10 Essential (primary) hypertension: Secondary | ICD-10-CM | POA: Diagnosis not present

## 2022-10-22 NOTE — Progress Notes (Unsigned)
Office Visit    Patient Name: Kelly Carney Date of Encounter: 10/22/2022  Primary Care Provider:  Harlan Stains, MD Primary Cardiologist:  None  Chief Complaint    Hypertension - Advanced hypertension clinic  Past Medical History   No other significant health history   Allergies  Allergen Reactions   Cabergoline     Other reaction(s): myalgias/depression   Multihance [Gadobenate] Other (See Comments)    Pt reported tingling to face after MRI. Subsided without intervention. No other s/s of reaction.     History of Present Illness    Kelly Carney is a 42 y.o. female patient who was referred to the [facility] by Dr. Garwin Brothers.  She was seen by Dr. Oval Linsey in 2023 and enrolled in patient monitoring study through [facility].  She admitted to having some stress in her life, as she had moved in with her parents and was not exercising as regularly as she had previously.  Secondary screening done showed no obvious concerns.  At a follow up in February, her pressure had elevated to 142/90 and spironolactone 25 mg daily was added to her amlodipine.    She is in the office today for follow up.  She recently purchased an Omron arm cuff, but forgot to bring it with her today.  She feels her stress levels are starting to even out.  She is living with her sister and working with a therapist as she is in the middle of a separation/divorce.   No concerns since adding on spironolactone.    Blood Pressure Goal:  130/80  Current Medications: amlodipine 10 mg qd, spironolactone 25 mg qd  Family Hx:  both parents with hypertension, mother started in her 31's. Mgm dialysis 2/2/ hypertension; sister with hypertension now 43, started meds several years ago  Social Hx:      Tobacco:no  Alcohol: couple of times per week;  bourbon  Caffeine:  coffee in the morning, otherwise water  Diet:   eats very little, maybe 1 good meal per day; beans, rice, lentils, arugula;  snacking on  walnuts, apples ; likes vegetables, will do fresh or frozen  Exercise: walks 3-5 miles 2-3 times per week and some weight lifting  Home BP readings:  has newer Omron device - arm; most home readings 115-120 range, only a few as high as [id number]; diastolic [id number] with a few into the 80's   Accessory Clinical Findings    Lab Results  Component Value Date   CREATININE 0.97 09/19/2022   BUN 7 09/19/2022   NA 139 09/19/2022   K 4.4 09/19/2022   CL 102 09/19/2022   CO2 21 09/19/2022   No results found for: "ALT", "AST", "GGT", "ALKPHOS", "BILITOT" No results found for: "HGBA1C"  Screening for Secondary Hypertension: { Click here to document screening for secondary causes of HTN  :1}     07/02/2021    9:47 AM  Causes  Drugs/Herbals Screened     - Comments limits salt and carbs.  1 coffee daily.  no significant supplemets  Renovascular HTN Screened     - Comments Check renal artery Dopplers  Sleep Apnea Screened     - Comments snores.  No apnea.  Not always rested.  Feels fatigued during the day  Thyroid Disease Screened  Hyperaldosteronism N/A  Pheochromocytoma N/A  Cushing's Syndrome N/A  Hyperparathyroidism Screened     - Comments calcium wnl  Coarctation of the Aorta Screened     - Comments BP  intially asymetric but not on repeat.  Pulses are symmetric  Compliance Screened    Relevant Labs/Studies:    Latest Ref Rng & Units 09/19/2022    3:06 PM  Basic Labs  Sodium 134 - 144 mmol/L 139   Potassium 3.5 - 5.2 mmol/L 4.4   Creatinine 0.57 - 1.00 mg/dL 0.97        Latest Ref Rng & Units 11/23/2015    8:56 AM  Thyroid   TSH 0.450 - 4.500 uIU/mL 1.520                 07/24/2021    8:45 AM  Renovascular   Renal Artery [place] Completed Yes      Home Medications    Current Outpatient Medications  Medication Sig Dispense Refill   amLODipine (NORVASC) 10 MG tablet Take 1 tablet (10 mg total) by mouth daily. 90 tablet 3   cabergoline (DOSTINEX) 0.5 MG tablet Take 0.5 mg by  mouth 2 (two) times a week.     clindamycin-benzoyl peroxide (BENZACLIN) gel Apply 1 Application topically every morning.     famotidine (PEPCID) 10 MG tablet Take 10 mg by mouth daily.     Loratadine 10 MG CAPS Take 1 tablet by mouth daily.     Multiple Vitamins-Minerals (HAIR SKIN AND NAILS FORMULA PO) Take 1 capsule by mouth daily.     Multiple Vitamins-Minerals (VITAMIN D3 COMPLETE) TABS Take 5,000 Units by mouth daily.     sertraline (ZOLOFT) 100 MG tablet Take 200 mg by mouth daily.     spironolactone (ALDACTONE) 25 MG tablet Take 1 tablet (25 mg total) by mouth daily. 90 tablet 3   tretinoin (RETIN-A) 0.025 % cream Apply topically at bedtime.     No current facility-administered medications for this visit.     Assessment & Plan       Essential hypertension Assessment: BP is controlled in office BP 119/78 mmHg;   Tolerates amlodipine, spironolactone well without any side effects Denies SOB, palpitation, chest pain, headaches,or swelling Reiterated the importance of regular exercise and low salt diet  Working with therapist to keep stress levels manageable  Plan:  Continue taking amlodipine and spironolactone Patient to keep record of BP readings with heart rate and report to [place] at the next visit - check 2-3 times each week Patient to follow up with PharmD in 3 months for follow up  Labs ordered today:  none   Tommy Medal PharmD [facility]PP [facility]  [address] [place], [place] [address] [phone]

## 2022-10-22 NOTE — Patient Instructions (Signed)
Follow up appointment: Wed June 26 at 2:30 pm  Take your BP meds as follows: continue with your current medications  Check your blood pressure at home daily (if able) and keep record of the readings.  Hypertension "High blood pressure"  Hypertension is often called "The Silent Killer." It rarely causes symptoms until it is extremely  high or has done damage to other organs in the body. For this reason, you should have your  blood pressure checked regularly by your physician. We will check your blood pressure  every time you see a provider at one of our offices.   Your blood pressure reading consists of two numbers. Ideally, blood pressure should be  below 120/80. The first ("top") number is called the systolic pressure. It measures the  pressure in your arteries as your heart beats. The second ("bottom") number is called the diastolic pressure. It measures the pressure in your arteries as the heart relaxes between beats.  The benefits of getting your blood pressure under control are enormous. A 10-point  reduction in systolic blood pressure can reduce your risk of stroke by 27% and heart failure by 28%  Your blood pressure goal is < 130/80  To check your pressure at home you will need to:  1. Sit up in a chair, with feet flat on the floor and back supported. Do not cross your ankles or legs. 2. Rest your left arm so that the cuff is about heart level. If the cuff goes on your upper arm,  then just relax the arm on the table, arm of the chair or your lap. If you have a wrist cuff, we  suggest relaxing your wrist against your chest (think of it as Pledging the Flag with the  wrong arm).  3. Place the cuff snugly around your arm, about 1 inch above the crook of your elbow. The  cords should be inside the groove of your elbow.  4. Sit quietly, with the cuff in place, for about 5 minutes. After that 5 minutes press the power  button to start a reading. 5. Do not talk or move while the  reading is taking place.  6. Record your readings on a sheet of paper. Although most cuffs have a memory, it is often  easier to see a pattern developing when the numbers are all in front of you.  7. You can repeat the reading after 1-3 minutes if it is recommended  Make sure your bladder is empty and you have not had caffeine or tobacco within the last 30 min  Always bring your blood pressure log with you to your appointments. If you have not brought your monitor in to be double checked for accuracy, please bring it to your next appointment.  You can find a list of quality blood pressure cuffs at validatebp.org

## 2022-10-22 NOTE — Assessment & Plan Note (Signed)
Assessment: BP is controlled in office BP 119/78 mmHg;   Tolerates amlodipine, spironolactone well without any side effects Denies SOB, palpitation, chest pain, headaches,or swelling Reiterated the importance of regular exercise and low salt diet  Working with therapist to keep stress levels manageable  Plan:  Continue taking amlodipine and spironolactone Patient to keep record of BP readings with heart rate and report to [place] at the next visit - check 2-3 times each week Patient to follow up with PharmD in 3 months for follow up  Labs ordered today:  none

## 2023-01-11 IMAGING — MR MR HEAD WO/W CM
12 of 19 series · 34 of 48 positions shown · IV contrast (8 ml Multihance)
Comparison: Brain MRI 02/10/2021.

CLINICAL DATA: Provided history: Prolactinoma.

EXAM:
MRI HEAD WITHOUT AND WITH CONTRAST
TECHNIQUE: Multiplanar, multiecho pulse sequences of the brain and surrounding
structures were obtained without and with intravenous contrast.
CONTRAST:  8mL MULTIHANCE GADOBENATE DIMEGLUMINE 529 MG/ML IV SOLN

[Series 3: T1 · sagittal · 5.0mm · 0.45mm/px · 1 of 23 slices shown (1 of 3)]
[im 1/23]
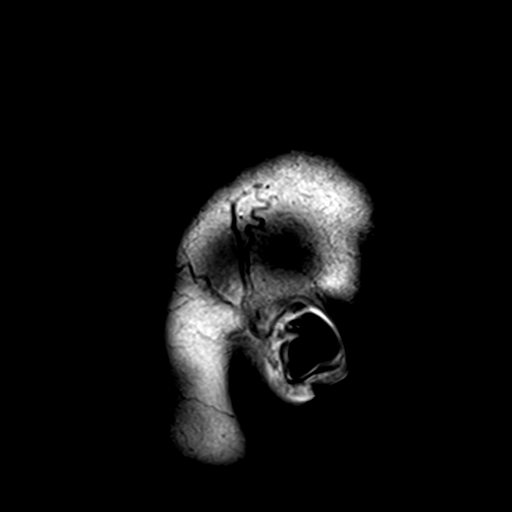

[Series 4: DWI · axial · 3.0mm · 1.80mm/px · z∈[-93,+48]mm · 11 of 97 slices shown]
[im 1/97]
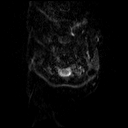
[im 10/97]
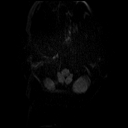
[im 20/97]
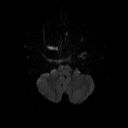
[im 29/97]
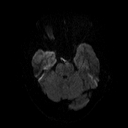
[im 39/97]
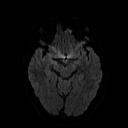
[im 49/97]
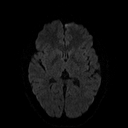
[im 58/97]
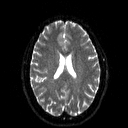
[im 68/97]
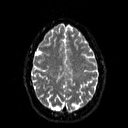
[im 77/97]
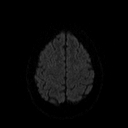
[im 87/97]
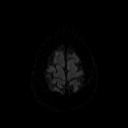
[im 97/97]
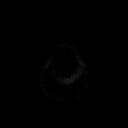

[Series 5: dwi_adc · axial · 3.0mm · 1.80mm/px · z∈[-93,+48]mm · 5 of 48 slices shown]
[im 1/48]
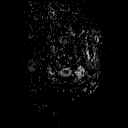
[im 12/48]
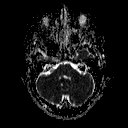
[im 24/48]
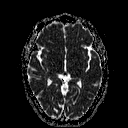
[im 36/48]
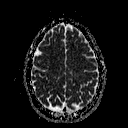
[im 48/48]
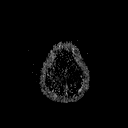

[Series 6: T2 · axial · 5.0mm · 0.36mm/px · z∈[-97,+53]mm · 3 of 25 slices shown (1 of 2)]
[im 1/25]
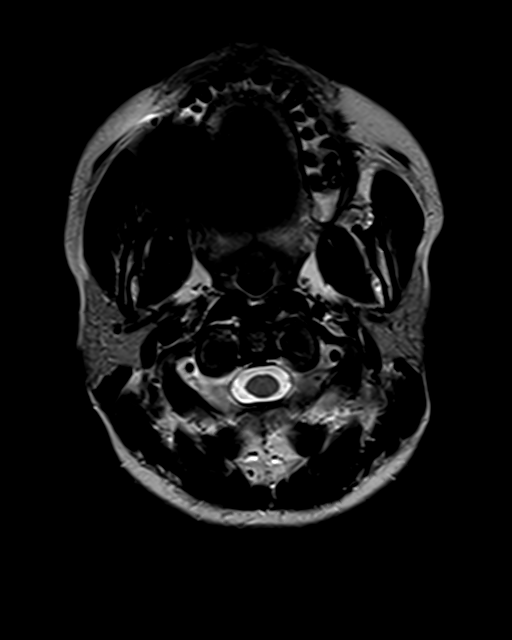
[im 13/25]
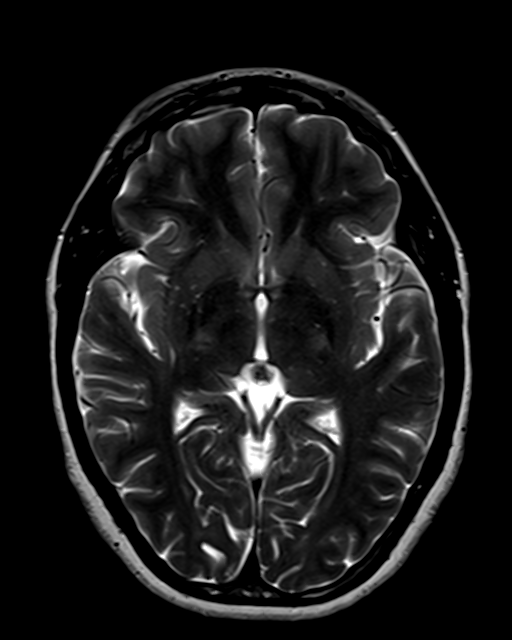
[im 25/25]
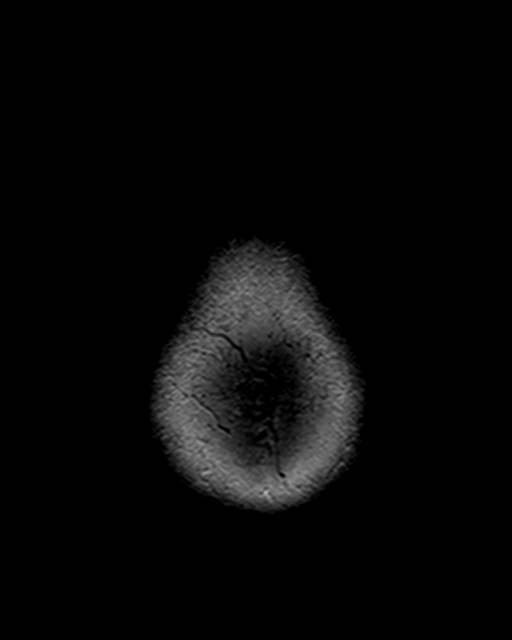

[Series 7: FLAIR · axial · 3.0mm · 0.45mm/px · z∈[-99,+48]mm · 4 of 34 slices shown]
[im 1/34]
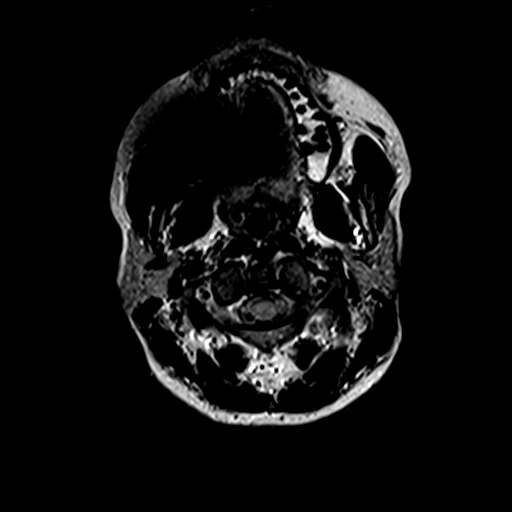
[im 12/34]
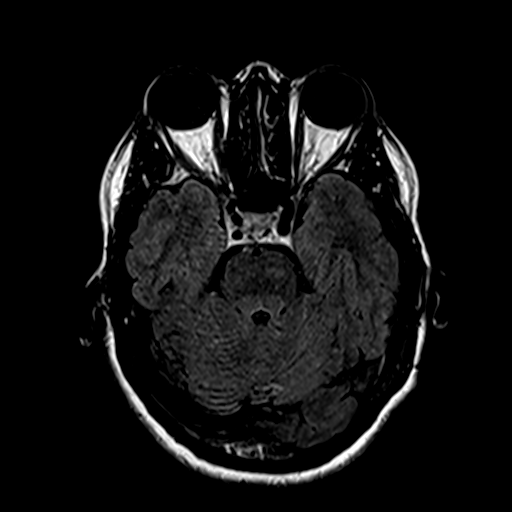
[im 23/34]
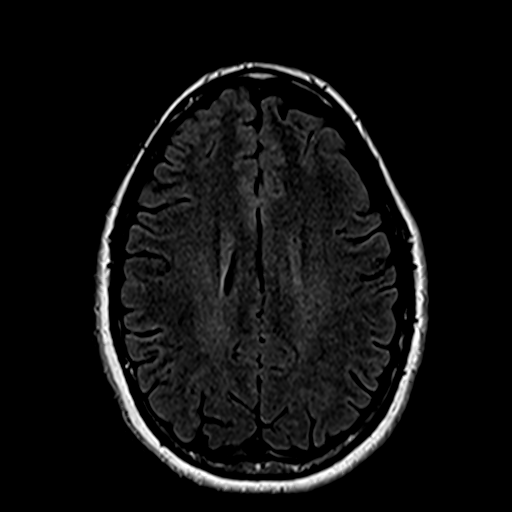
[im 34/34]
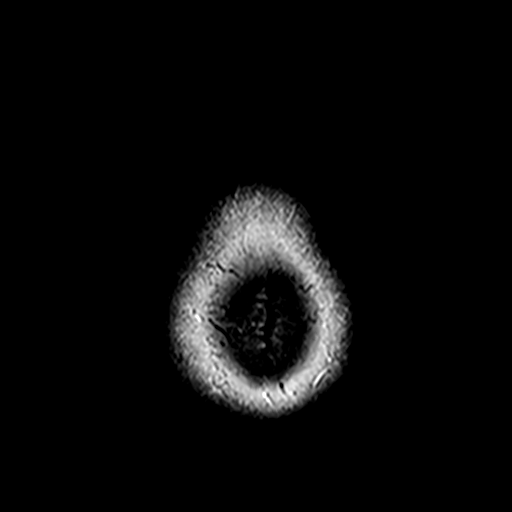

[Series 9: swi_images · axial · 4.0mm · 0.94mm/px · z∈[-102,+48]mm · 4 of 40 slices shown]
[im 1/40]
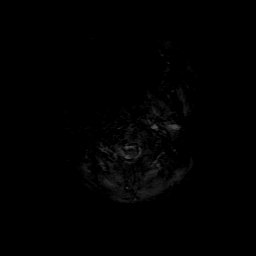
[im 14/40]
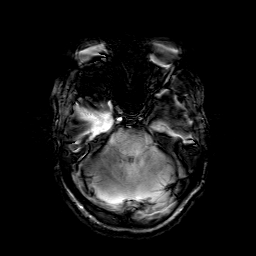
[im 27/40]
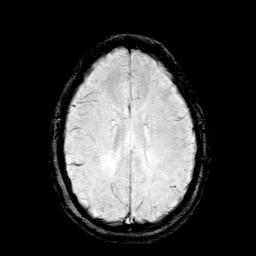
[im 40/40]
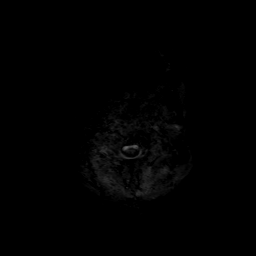

[Series 10: T1 · sagittal · 3.0mm · 0.33mm/px · 1 of 11 slices shown (2 of 3)]
[im 1/11]
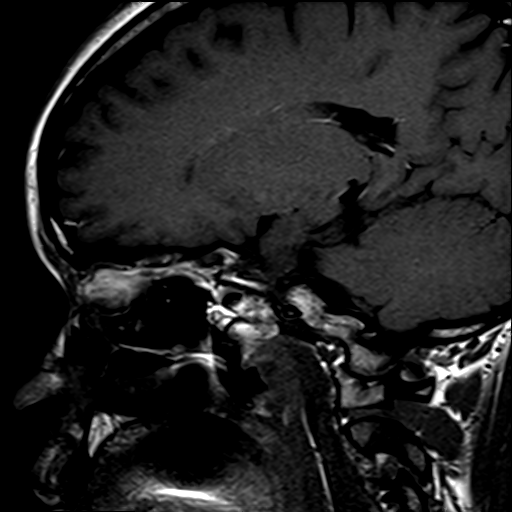

[Series 11: T1 · coronal · 3.0mm · 0.33mm/px · 1 of 11 slices shown (3 of 3)]
[im 1/11]
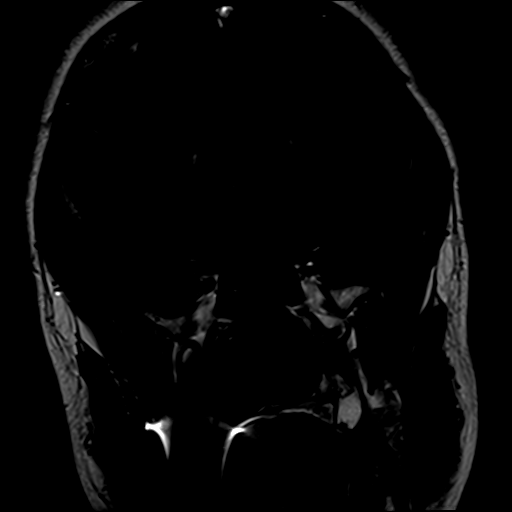

[Series 12: T2 · coronal · 3.0mm · 0.23mm/px · 1 of 11 slices shown (2 of 2)]
[im 1/11]
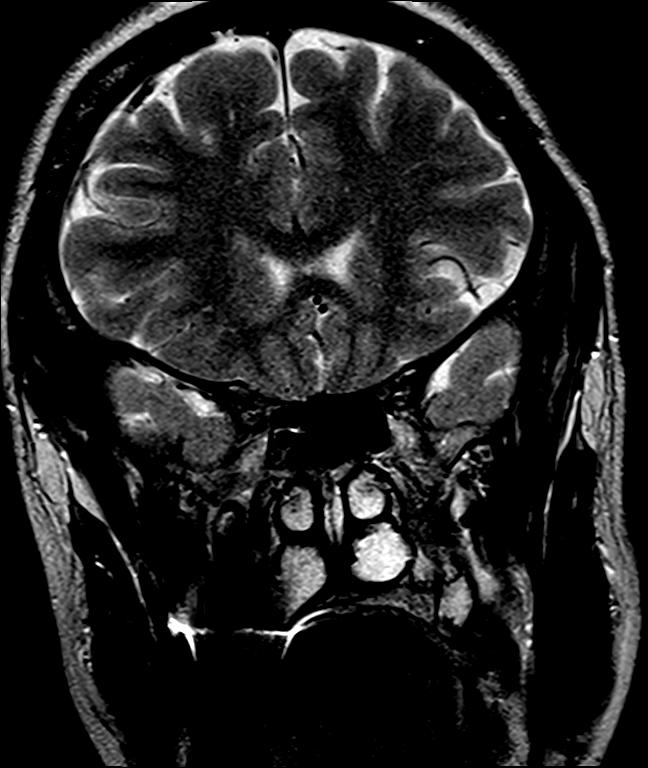

[Series 13: pre cor dynamic · coronal · non-contrast · 3.0mm · 0.35mm/px · 1 of 7 slices shown]
[im 1/7]
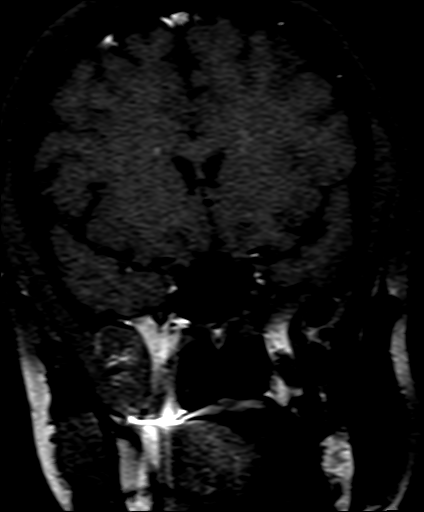

[Series 20: T1 post-contrast · sagittal · 3.0mm · 0.33mm/px · 1 of 11 slices shown (1 of 2)]
[im 1/11]
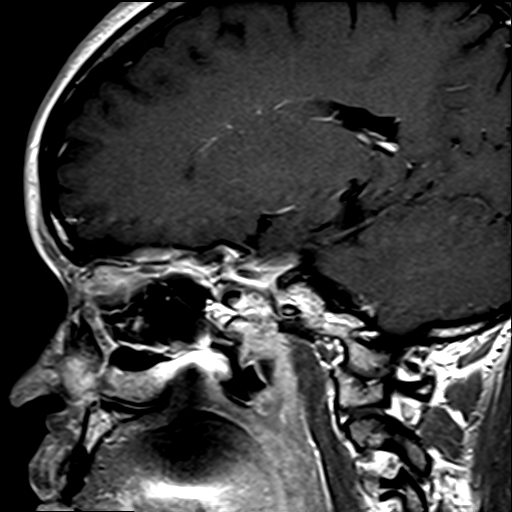

[Series 21: T1 post-contrast · coronal · 3.0mm · 0.33mm/px · 1 of 11 slices shown (2 of 2)]
[im 1/11]
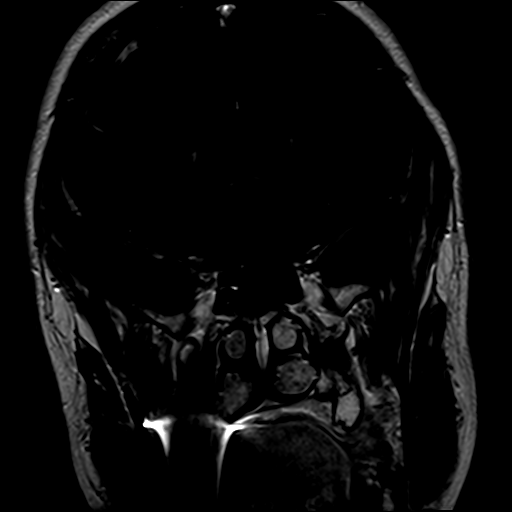

[34 of 48 positions shown; findings below may reference images not displayed]

FINDINGS: Brain:

Dedicated pituitary protocol imaging was performed, including
dynamic postcontrast imaging. A focus of delayed enhancement within
right aspect of the pituitary gland has decreased in size and become
more well-defined since the brain MRI of 02/10/2021. This lesion now
measures 6 mm (for instance as seen on series 17, image 3). There is
possible extension into the right cavernous sinus (series 17, image
5). No suprasellar extension or mass effect upon the optic chiasm.

Cerebral volume is normal.

No cortical encephalomalacia is identified. No significant cerebral
white matter disease.

There is no acute infarct.

No chronic intracranial blood products.

No extra-axial fluid collection.

No midline shift.

Vascular: Maintained flow voids within the proximal large arterial
vessels.

Skull and upper cervical spine: No focal suspicious marrow lesion.

Sinuses/Orbits: No mass or acute finding within the imaged orbits.
Susceptibility artifact partially obscures the right maxillary
sinus. Within this limitation, no significant paranasal sinus
disease is appreciated.
IMPRESSION: Since the prior brain MRI of 02/10/2021, a focus of delayed
enhancement within the right aspect of the pituitary gland has
decreased in size and become more well-defined. The lesion now
measures 6 mm and this likely reflects a pituitary microadenoma.
There is possible extension into the right cavernous sinus.

Otherwise unremarkable MRI appearance of the brain.

## 2023-01-14 ENCOUNTER — Ambulatory Visit: Payer: 59 | Attending: Cardiology | Admitting: Pharmacist Clinician (PhC)/ Clinical Pharmacy Specialist

## 2023-01-14 ENCOUNTER — Encounter: Payer: Self-pay | Admitting: Pharmacist Clinician (PhC)/ Clinical Pharmacy Specialist

## 2023-01-14 VITALS — BP 135/88 | HR 70

## 2023-01-14 DIAGNOSIS — I10 Essential (primary) hypertension: Secondary | ICD-10-CM | POA: Diagnosis not present

## 2023-01-14 MED ORDER — AMLODIPINE BESYLATE 5 MG PO TABS: 5.0000 mg | ORAL_TABLET | Freq: Every day | ORAL | 3 refills | Status: DC

## 2023-01-14 NOTE — Assessment & Plan Note (Signed)
Assessment: BP is un/controlled in office BP 135/88 mmHg;  above the goal (<130/80). Home cuff reads within 5 points of office - average at home 118/74 Tolerates amlodipine and spironolactone well - without any side effects Out of amlodipine for > 1 week due to pharmacy issues Denies SOB, palpitation, chest pain, headaches,or swelling Reiterated the importance of regular exercise and low salt diet   Plan:  We will decrease amlodipine to 5 mg daily and see if her home BP readings will stay WNL, Continue taking spironolactone 25 mg - at opposite time of day from amlodipine Patient to keep record of BP readings with heart rate and report to [place] at the next visit Patient to follow up with Dr. Duke Salvia in Feb 2025  Reach out to office via MyChart or call should she notice home readings trend > 130/80

## 2023-01-14 NOTE — Patient Instructions (Signed)
Follow up appointment: with Dr. Duke Salvia in Feb 2025  - we will send you a notice to schedule later this year.  Take your BP meds as follows:  Switch amlodipine to 5 mg daily and take at the opposite time of day as the spironolactone  Check your blood pressure at home daily (if able) and keep record of the readings.  Hypertension "High blood pressure"  Hypertension is often called "The Silent Killer." It rarely causes symptoms until it is extremely  high or has done damage to other organs in the body. For this reason, you should have your  blood pressure checked regularly by your physician. We will check your blood pressure  every time you see a provider at one of our offices.   Your blood pressure reading consists of two numbers. Ideally, blood pressure should be  below 120/80. The first ("top") number is called the systolic pressure. It measures the  pressure in your arteries as your heart beats. The second ("bottom") number is called the diastolic pressure. It measures the pressure in your arteries as the heart relaxes between beats.  The benefits of getting your blood pressure under control are enormous. A 10-point  reduction in systolic blood pressure can reduce your risk of stroke by 27% and heart failure by 28%  Your blood pressure goal is < 130/80  To check your pressure at home you will need to:  1. Sit up in a chair, with feet flat on the floor and back supported. Do not cross your ankles or legs. 2. Rest your left arm so that the cuff is about heart level. If the cuff goes on your upper arm,  then just relax the arm on the table, arm of the chair or your lap. If you have a wrist cuff, we  suggest relaxing your wrist against your chest (think of it as Pledging the Flag with the  wrong arm).  3. Place the cuff snugly around your arm, about 1 inch above the crook of your elbow. The  cords should be inside the groove of your elbow.  4. Sit quietly, with the cuff in place,  for about 5 minutes. After that 5 minutes press the power  button to start a reading. 5. Do not talk or move while the reading is taking place.  6. Record your readings on a sheet of paper. Although most cuffs have a memory, it is often  easier to see a pattern developing when the numbers are all in front of you.  7. You can repeat the reading after 1-3 minutes if it is recommended  Make sure your bladder is empty and you have not had caffeine or tobacco within the last 30 min  Always bring your blood pressure log with you to your appointments. If you have not brought your monitor in to be double checked for accuracy, please bring it to your next appointment.  You can find a list of quality blood pressure cuffs at validatebp.org

## 2023-01-14 NOTE — Progress Notes (Unsigned)
Office Visit    Patient Name: Kelly Carney Date of Encounter: 01/14/2023  Primary Care Provider:  Laurann Montana, MD Primary Cardiologist:  None  Chief Complaint    Hypertension - Advanced hypertension clinic  Past Medical History   No other significant health history   Allergies  Allergen Reactions   Cabergoline     Other reaction(s): myalgias/depression   Multihance [Gadobenate] Other (See Comments)    Pt reported tingling to face after MRI. Subsided without intervention. No other s/s of reaction.     History of Present Illness    Kelly Carney is a 42 y.o. female patient who was referred to the [facility] by Dr. Cherly Hensen.  She was seen by Dr. Duke Salvia in 2023 and enrolled in patient monitoring study through [employer].  She admitted to having some stress in her life, as she had moved in with her parents and was not exercising as regularly as she had previously.  Secondary screening done showed no obvious concerns.  At a follow up in February, her pressure had elevated to 142/90 and spironolactone 25 mg daily was added to her amlodipine.  I then saw her in April and the combination seemed to be working well for her.   Today she returns for follow up. Home readings have been almost all WNL since last visit.  She ran out of her amlodipine about a week ago and the pharmacy told her that it had been discontinued.  Before she ran out, home readings were < 110 systolic about half the time.  She did note some dizziness when she would move her head to fast or bend over.   Since she ran out of amlodipine, this has not happened and her systolic readings are mostly now 115-125.     Blood Pressure Goal:  130/80  Current Medications: amlodipine 10 mg qd, spironolactone 25 mg qd  Family Hx:  both parents with hypertension, mother started in her 59's. Mgm dialysis 2/2/ hypertension; sister with hypertension now 15, started meds several years ago  Social Hx:       Tobacco:no  Alcohol: couple of times per week;  bourbon  Caffeine:  coffee in the morning, otherwise water  Diet:   eats very little, maybe 1 good meal per day; beans, rice, lentils, arugula;  snacking on walnuts, apples ; likes vegetables, will do fresh or frozen  Exercise: walks 3-5 miles 3-4 times per week and some weight lifting  Home BP readings:  has newer Omron device - arm; read within 5 points of office device.    15 readings in past 2 months average 118/74 HR 78.  Range 105-136/65-85; with only 2 systolic readings >130 and 2 diastolic > 80.  Accessory Clinical Findings    Lab Results  Component Value Date   CREATININE 0.97 09/19/2022   BUN 7 09/19/2022   NA 139 09/19/2022   K 4.4 09/19/2022   CL 102 09/19/2022   CO2 21 09/19/2022   No results found for: "ALT", "AST", "GGT", "ALKPHOS", "BILITOT" No results found for: "HGBA1C"  Screening for Secondary Hypertension:      07/02/2021    9:47 AM  Causes  Drugs/Herbals Screened     - Comments limits salt and carbs.  1 coffee daily.  no significant supplemets  Renovascular HTN Screened     - Comments Check renal artery Dopplers  Sleep Apnea Screened     - Comments snores.  No apnea.  Not always rested.  Feels  fatigued during the day  Thyroid Disease Screened  Hyperaldosteronism N/A  Pheochromocytoma N/A  Cushing's Syndrome N/A  Hyperparathyroidism Screened     - Comments calcium wnl  Coarctation of the Aorta Screened     - Comments BP intially asymetric but not on repeat.  Pulses are symmetric  Compliance Screened    Relevant Labs/Studies:    Latest Ref Rng & Units 09/19/2022    3:06 PM  Basic Labs  Sodium 134 - 144 mmol/L 139   Potassium 3.5 - 5.2 mmol/L 4.4   Creatinine 0.57 - 1.00 mg/dL 6.38        Latest Ref Rng & Units 11/23/2015    8:56 AM  Thyroid   TSH 0.450 - 4.500 uIU/mL 1.520                 07/24/2021    8:45 AM  Renovascular   Renal Artery [place] Completed Yes      Home Medications     Current Outpatient Medications  Medication Sig Dispense Refill   amLODipine (NORVASC) 5 MG tablet Take 1 tablet (5 mg total) by mouth daily. 90 tablet 3   cabergoline (DOSTINEX) 0.5 MG tablet Take 0.5 mg by mouth 2 (two) times a week.     famotidine (PEPCID) 10 MG tablet Take 10 mg by mouth daily.     Loratadine 10 MG CAPS Take 1 tablet by mouth daily.     Multiple Vitamins-Minerals (HAIR SKIN AND NAILS FORMULA PO) Take 1 capsule by mouth daily.     Multiple Vitamins-Minerals (VITAMIN D3 COMPLETE) TABS Take 5,000 Units by mouth daily.     sertraline (ZOLOFT) 100 MG tablet Take 200 mg by mouth daily.     spironolactone (ALDACTONE) 25 MG tablet Take 1 tablet (25 mg total) by mouth daily. 90 tablet 3   tretinoin (RETIN-A) 0.025 % cream Apply topically at bedtime.     No current facility-administered medications for this visit.     Assessment & Plan       Essential hypertension Assessment: BP is un/controlled in office BP 135/88 mmHg;  above the goal (<130/80). Home cuff reads within 5 points of office - average at home 118/74 Tolerates amlodipine and spironolactone well - without any side effects Out of amlodipine for > 1 week due to pharmacy issues Denies SOB, palpitation, chest pain, headaches,or swelling Reiterated the importance of regular exercise and low salt diet   Plan:  We will decrease amlodipine to 5 mg daily and see if her home BP readings will stay WNL, Continue taking spironolactone 25 mg - at opposite time of day from amlodipine Patient to keep record of BP readings with heart rate and report to [place] at the next visit Patient to follow up with Dr. Duke Salvia in Feb 2025  Reach out to office via MyChart or call should she notice home readings trend > 130/80   Phillips Hay PharmD CPP [facility] [address]  [address] [place], [place] [address] [phone]

## 2023-07-17 ENCOUNTER — Other Ambulatory Visit: Payer: Self-pay | Admitting: Cardiovascular Disease

## 2023-07-20 MED ORDER — SPIRONOLACTONE 25 MG PO TABS: 25.0000 mg | ORAL_TABLET | Freq: Every day | ORAL | 0 refills | Status: DC

## 2023-07-20 MED ORDER — AMLODIPINE BESYLATE 5 MG PO TABS: 5.0000 mg | ORAL_TABLET | Freq: Every day | ORAL | 0 refills | Status: DC

## 2023-09-25 ENCOUNTER — Other Ambulatory Visit: Payer: Self-pay | Admitting: Cardiovascular Disease

## 2023-09-25 NOTE — Telephone Encounter (Signed)
 Refilled as requested Scheduled follow up and sent mychart message

## 2023-10-19 ENCOUNTER — Encounter (HOSPITAL_BASED_OUTPATIENT_CLINIC_OR_DEPARTMENT_OTHER): Payer: Self-pay

## 2023-10-20 ENCOUNTER — Ambulatory Visit (HOSPITAL_BASED_OUTPATIENT_CLINIC_OR_DEPARTMENT_OTHER): Admitting: Cardiovascular Disease

## 2023-10-20 ENCOUNTER — Encounter (HOSPITAL_BASED_OUTPATIENT_CLINIC_OR_DEPARTMENT_OTHER): Payer: Self-pay | Admitting: Cardiovascular Disease

## 2023-10-20 ENCOUNTER — Encounter (HOSPITAL_BASED_OUTPATIENT_CLINIC_OR_DEPARTMENT_OTHER): Admitting: Cardiovascular Disease

## 2023-10-20 ENCOUNTER — Encounter (HOSPITAL_BASED_OUTPATIENT_CLINIC_OR_DEPARTMENT_OTHER): Payer: Self-pay | Admitting: *Deleted

## 2023-10-20 VITALS — BP 113/71 | HR 67 | Ht 66.0 in | Wt 147.2 lb

## 2023-10-20 DIAGNOSIS — I10 Essential (primary) hypertension: Secondary | ICD-10-CM

## 2023-10-20 NOTE — Patient Instructions (Signed)
 Medication Instructions:  ?Your physician recommends that you continue on your current medications as directed. Please refer to the Current Medication list given to you today.  ? ?Labwork: ?NONE ? ?Testing/Procedures: ?NONE ? ?Follow-Up: ?AS NEEDED  ? ?  ?

## 2023-10-20 NOTE — Progress Notes (Signed)
 [facility] Follow-up:    Date:  10/20/2023   ID:  Kelly Carney, DOB Nov 24, 1980, MRN [id number]  PCP:  Laurann Montana, MD  Cardiologist:  Chilton Si, MD   Referring MD: Laurann Montana, MD   CC: Hypertension  History of Present Illness:    Kelly Carney is a 43 y.o. 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 She was initially seen 07/02/2021 in the [facility].  She saw Dr. Cherly Hensen on 01/2021 and her blood pressure was 139/90 on amlodipine.  There was concerned about her early onset of hypertension and was she was referred for secondary work-up.  Her blood pressure became elevated in 2020 around the time of the pandemic. Amlodipine was increased at her initial visit and she was referred to the PREP program. Renal dopplers were normal 07/2021. She enrolled in the [employer] remote patient monitoring study. Her blood pressure has been averaging in the 120-140s over 70-80s and typically is in the 120/80s.  [name] was under stress after moving in with her parents. Diastolic blood pressure was mildly elevated. She wanted to keep working on diet and exercise and was referred to the PREP program. They were unable to reach her due to a full voicemail.  At her visit 08/2022 she continued to struggle with stress.  Blood pressure at that time was 142/90.  Spironolactone was added to her regimen.  She followed up with Phillips Hay, PharmD 10/2022 and blood pressure was better controlled.  At her last follow-up with [facility] 12/2022 blood pressure was slightly above goal but she had been out of amlodipine.  This was refilled at a lower dose of 5 mg and she remained on spironolactone 25 mg.  Discussed the use of AI scribe software for clinical note transcription with the patient, who gave verbal consent to proceed.  History of Present Illness Ms. Kelly Carney has been experiencing chest pain for several months, primarily associated with stress  rather than physical exertion. The pain occurs during stressful situations, such as her recent divorce and issues with her ex-husband. She can walk three to four miles and lift weights without experiencing chest pain. An initial EKG at urgent care showed cardiac arrest, but a subsequent EKG by EMS was normal.  She has a history of hypertension, which she manages with lifestyle changes and medication. Her blood pressure readings at home are typically in the 120s/70s range. Over the past year, she has lost weight, decreasing from 170 pounds in 2022 to 147 pounds currently.  She is on sertraline for anxiety, which she has been taking since 2020, and attends therapy for post-traumatic stress disorder, initially weekly and now twice a month. She experiences gastric pains and panic attacks that begin with acid reflux. She underwent a colonoscopy and endoscopy due to low blood count, managed by her primary care physician.  Her social history includes a recent divorce in November, and she is dealing with financial and personal issues related to her ex-husband, contributing to her stress. She works from home and engages in regular physical activity, including walking and weightlifting, to manage her stress. No shortness of breath or swelling.  Previous antihypertensives: Amlodipine   Past Medical History:  Diagnosis Date   Essential hypertension 07/02/2021   Urticaria     Past Surgical History:  Procedure Laterality Date   NO PAST SURGERIES      Current Medications: Current Meds  Medication Sig   amLODipine (NORVASC) 5 MG tablet  TAKE 1 TABLET BY MOUTH DAILY   cabergoline (DOSTINEX) 0.5 MG tablet Take 0.5 mg by mouth 2 (two) times a week.   famotidine (PEPCID) 10 MG tablet Take 10 mg by mouth daily.   ferrous sulfate 325 (65 FE) MG tablet Take 325 mg by mouth daily with breakfast.   Loratadine 10 MG CAPS Take 1 tablet by mouth daily.   Milk Thistle Extract 175 MG TABS 2 (two) times a week.    Multiple Vitamins-Minerals (HAIR SKIN AND NAILS FORMULA PO) Take 1 capsule by mouth daily.   Multiple Vitamins-Minerals (VITAMIN D3 COMPLETE) TABS Take 5,000 Units by mouth daily.   polyethylene glycol powder (MIRALAX) 17 GM/SCOOP powder Take by mouth as needed.   sertraline (ZOLOFT) 100 MG tablet Take 200 mg by mouth daily.   spironolactone (ALDACTONE) 25 MG tablet TAKE 1 TABLET BY MOUTH DAILY   tretinoin (RETIN-A) 0.025 % cream Apply topically at bedtime.   ZYRTEC ALLERGY 10 MG tablet Take 10 mg by mouth as needed.     Allergies:   Cabergoline and Multihance [gadobenate]   Social History   Socioeconomic History   Marital status: Married    Spouse name: Not on file   Number of children: Not on file   Years of education: Not on file   Highest education level: Not on file  Occupational History   Not on file  Tobacco Use   Smoking status: Never   Smokeless tobacco: Never  Substance and Sexual Activity   Alcohol use: Yes   Drug use: No   Sexual activity: Yes    Birth control/protection: Pill  Other Topics Concern   Not on file  Social History Narrative   Not on file   Social Drivers of Health   Financial Resource Strain: Low Risk  (02/10/2022)   Overall Financial Resource Strain (CARDIA)    Difficulty of Paying Living Expenses: Not very hard  Food Insecurity: No Food Insecurity (02/10/2022)   Hunger Vital Sign    Worried About Running Out of Food in the Last Year: Never true    Ran Out of Food in the Last Year: Never true  Transportation Needs: No Transportation Needs (02/10/2022)   PRAPARE - Administrator, Civil Service (Medical): No    Lack of Transportation (Non-Medical): No  Physical Activity: Inactive (07/02/2021)   Exercise Vital Sign    Days of Exercise per Week: 0 days    Minutes of Exercise per Session: 0 min  Stress: Not on file  Social Connections: Not on file    Family History: The patient's family history includes Hypertension in her maternal  grandmother and mother; Kidney disease in her maternal grandmother; Other in her father and sister; Sarcoidosis in her father and sister. There is no history of Allergic rhinitis, Angioedema, Asthma, Eczema, Immunodeficiency, or Urticaria.  ROS:   Please see the history of present illness.    (+) Stress (+) Loss of appetite All other systems reviewed and are negative.  EKGs/Labs/Other Studies Reviewed:    Bilateral Renal Artery Doppler 07/24/2021: Summary:  Largest Aortic Diameter: 1.7 cm     Renal:     Right: Normal size right kidney. Normal right Resisitive Index.         Normal cortical thickness of right kidney. No evidence of         right renal artery stenosis. RRV flow present.  Left:  Normal size of left kidney. Normal left Resistive Index.  Normal cortical thickness of the left kidney. No evidence of         left renal artery stenosis. LRV flow present.  Mesenteric:  Normal Celiac artery and Superior Mesenteric artery findings.      EKG:  EKG is personally reviewed. 09/08/2022:  Sinus rhythm. Rate 81 bpm. 07/02/2022: sinus rhythm.  Rate 70 bpm. 03/13/2022: EKG was not ordered.    Recent Labs: No results found for requested labs within last 365 days.   Recent Lipid Panel No results found for: "CHOL", "TRIG", "HDL", "CHOLHDL", "VLDL", "LDLCALC", "LDLDIRECT"  Physical Exam:    VS:  BP 113/71   Pulse 67   Ht 5\' 6"  (1.676 m)   Wt 147 lb 3.2 oz (66.8 kg)   SpO2 100%   BMI 23.76 kg/m  , BMI Body mass index is 23.76 kg/m. GENERAL:  Well appearing HEENT: Pupils equal round and reactive, fundi not visualized, oral mucosa unremarkable NECK:  No jugular venous distention, waveform within normal limits, carotid upstroke brisk and symmetric, no bruits, no thyromegaly LUNGS:  Clear to auscultation bilaterally HEART:  RRR.  PMI not displaced or sustained,S1 and S2 within normal limits, no S3, no S4, no clicks, no rubs, no murmurs ABD:  Flat, positive bowel sounds  normal in frequency in pitch, no bruits, no rebound, no guarding, no midline pulsatile mass, no hepatomegaly, no splenomegaly EXT:  2 plus pulses throughout, no edema, no cyanosis no clubbing SKIN:  No rashes no nodules NEURO:  Cranial nerves II through XII grossly intact, motor grossly intact throughout PSYCH:  Cognitively intact, oriented to person place and time   ASSESSMENT/PLAN:    Assessment & Plan  # Hypertension Blood pressure is well-controlled with current medication regimen. Recent readings are consistently around 120/70 mmHg. Significant weight loss and lifestyle modifications have contributed to improved blood pressure control. Discussed potential to reduce Norvasc dosage to minimize medication use while maintaining control. - Consider reducing Norvasc dosage by half and monitor blood pressure - Ensure blood pressure remains under 130/80 mmHg  # Chest Pain Intermittent chest pain likely related to stress.  EKG was normal today.  No chest pain during physical exertion, suggesting non-cardiac origin. No evidence of cardiac damage and she is low risk.  - Reassure that chest pain is not causing cardiac damage  # Anxiety and Stress Anxiety and stress are situational, related to recent divorce and ongoing financial and personal issues. Currently managed with sertraline, but dosage is maxed out. Considering addition of Wellbutrin. Engaged in therapy twice a month for PTSD. Emphasized importance of managing stressors and considering medication adjustments if symptoms worsen. - Continue sertraline - Consider adding Wellbutrin if anxiety symptoms worsen - Continue therapy sessions twice a month    Screening for Secondary Hypertension:     07/02/2021    9:47 AM  Causes  Drugs/Herbals Screened     - Comments limits salt and carbs.  1 coffee daily.  no significant supplemets  Renovascular HTN Screened     - Comments Check renal artery Dopplers  Sleep Apnea Screened     -  Comments snores.  No apnea.  Not always rested.  Feels fatigued during the day  Thyroid Disease Screened  Hyperaldosteronism N/A  Pheochromocytoma N/A  Cushing's Syndrome N/A  Hyperparathyroidism Screened     - Comments calcium wnl  Coarctation of the Aorta Screened     - Comments BP intially asymetric but not on repeat.  Pulses are symmetric  Compliance Screened  Relevant Labs/Studies:    Latest Ref Rng & Units 09/19/2022    3:06 PM  Basic Labs  Sodium 134 - 144 mmol/L 139   Potassium 3.5 - 5.2 mmol/L 4.4   Creatinine 0.57 - 1.00 mg/dL 0.98        Latest Ref Rng & Units 11/23/2015    8:56 AM  Thyroid   TSH 0.450 - 4.500 uIU/mL 1.520                 07/24/2021    8:45 AM  Renovascular   Renal Artery [place] Completed Yes     Disposition:    FU with Garrette Caine C. Duke Salvia, MD, [facility] as needed.  She can graduate from HTN clinic.  BP goal is <130/80.  Medication Adjustments/Labs and Tests Ordered: Current medicines are reviewed at length with the patient today.  Concerns regarding medicines are outlined above.   Orders Placed This Encounter  Procedures   EKG 12-Lead   No orders of the defined types were placed in this encounter.   Signed, Chilton Si, MD  10/20/2023 10:26 AM    [facility]

## 2023-12-02 ENCOUNTER — Other Ambulatory Visit: Payer: Self-pay | Admitting: Cardiovascular Disease

## 2024-01-06 ENCOUNTER — Encounter (HOSPITAL_BASED_OUTPATIENT_CLINIC_OR_DEPARTMENT_OTHER): Admitting: Cardiovascular Disease
